# Patient Record
Sex: Female | Born: 1960 | Race: White | Hispanic: No | Smoking: Former smoker
Health system: Southern US, Community
[De-identification: ages and names within clinical notes are randomized; demographics above are authoritative.]

## PROBLEM LIST (undated history)

## (undated) DIAGNOSIS — Z87442 Personal history of urinary calculi: Secondary | ICD-10-CM

## (undated) DIAGNOSIS — I341 Nonrheumatic mitral (valve) prolapse: Secondary | ICD-10-CM

## (undated) DIAGNOSIS — T8859XA Other complications of anesthesia, initial encounter: Secondary | ICD-10-CM

## (undated) DIAGNOSIS — I10 Essential (primary) hypertension: Secondary | ICD-10-CM

## (undated) DIAGNOSIS — J189 Pneumonia, unspecified organism: Secondary | ICD-10-CM

## (undated) DIAGNOSIS — I499 Cardiac arrhythmia, unspecified: Secondary | ICD-10-CM

## (undated) DIAGNOSIS — R112 Nausea with vomiting, unspecified: Secondary | ICD-10-CM

## (undated) DIAGNOSIS — T4145XA Adverse effect of unspecified anesthetic, initial encounter: Secondary | ICD-10-CM

## (undated) DIAGNOSIS — Z9889 Other specified postprocedural states: Secondary | ICD-10-CM

## (undated) DIAGNOSIS — M1990 Unspecified osteoarthritis, unspecified site: Secondary | ICD-10-CM

## (undated) DIAGNOSIS — C4490 Unspecified malignant neoplasm of skin, unspecified: Secondary | ICD-10-CM

## (undated) DIAGNOSIS — K219 Gastro-esophageal reflux disease without esophagitis: Secondary | ICD-10-CM

## (undated) HISTORY — PX: BUNIONECTOMY: SHX129

## (undated) HISTORY — PX: DILATION AND CURETTAGE OF UTERUS: SHX78

## (undated) HISTORY — PX: OTHER SURGICAL HISTORY: SHX169

## (undated) HISTORY — PX: INCONTINENCE SURGERY: SHX676

## (undated) HISTORY — PX: ABDOMINAL HYSTERECTOMY: SHX81

## (undated) HISTORY — DX: Unspecified malignant neoplasm of skin, unspecified: C44.90

## (undated) HISTORY — PX: BREAST SURGERY: SHX581

---

## 2004-09-16 ENCOUNTER — Other Ambulatory Visit: Payer: Self-pay

## 2004-09-16 ENCOUNTER — Emergency Department: Payer: Self-pay | Admitting: Emergency Medicine

## 2007-11-25 ENCOUNTER — Encounter (INDEPENDENT_AMBULATORY_CARE_PROVIDER_SITE_OTHER): Payer: Self-pay | Admitting: *Deleted

## 2007-11-25 ENCOUNTER — Ambulatory Visit (HOSPITAL_COMMUNITY): Admission: RE | Admit: 2007-11-25 | Discharge: 2007-11-25 | Payer: Self-pay | Admitting: *Deleted

## 2009-04-15 ENCOUNTER — Encounter: Admission: RE | Admit: 2009-04-15 | Discharge: 2009-04-15 | Payer: Self-pay | Admitting: Obstetrics

## 2009-04-26 ENCOUNTER — Encounter: Admission: RE | Admit: 2009-04-26 | Discharge: 2009-04-26 | Payer: Self-pay | Admitting: Obstetrics

## 2009-05-26 ENCOUNTER — Ambulatory Visit (HOSPITAL_BASED_OUTPATIENT_CLINIC_OR_DEPARTMENT_OTHER): Admission: RE | Admit: 2009-05-26 | Discharge: 2009-05-26 | Payer: Self-pay | Admitting: Surgery

## 2009-05-26 ENCOUNTER — Encounter: Admission: RE | Admit: 2009-05-26 | Discharge: 2009-05-26 | Payer: Self-pay | Admitting: Surgery

## 2009-05-26 ENCOUNTER — Encounter (INDEPENDENT_AMBULATORY_CARE_PROVIDER_SITE_OTHER): Payer: Self-pay | Admitting: Surgery

## 2010-03-30 ENCOUNTER — Encounter: Admission: RE | Admit: 2010-03-30 | Discharge: 2010-03-30 | Payer: Self-pay | Admitting: Obstetrics

## 2010-04-07 ENCOUNTER — Encounter: Admission: RE | Admit: 2010-04-07 | Discharge: 2010-04-07 | Payer: Self-pay | Admitting: Obstetrics

## 2010-04-19 ENCOUNTER — Ambulatory Visit (HOSPITAL_COMMUNITY): Admission: RE | Admit: 2010-04-19 | Discharge: 2010-04-19 | Payer: Self-pay | Admitting: Obstetrics

## 2010-12-10 ENCOUNTER — Encounter: Payer: Self-pay | Admitting: Obstetrics

## 2011-02-05 LAB — CBC
Platelets: 282 10*3/uL (ref 150–400)
RBC: 4.33 MIL/uL (ref 3.87–5.11)
RDW: 13.4 % (ref 11.5–15.5)
WBC: 5.9 10*3/uL (ref 4.0–10.5)

## 2011-02-05 LAB — URINALYSIS, ROUTINE W REFLEX MICROSCOPIC
Bilirubin Urine: NEGATIVE
Hgb urine dipstick: NEGATIVE

## 2011-02-05 LAB — TYPE AND SCREEN: ABO/RH(D): A POS

## 2011-03-07 ENCOUNTER — Other Ambulatory Visit: Payer: Self-pay | Admitting: Obstetrics

## 2011-03-07 DIAGNOSIS — Z1231 Encounter for screening mammogram for malignant neoplasm of breast: Secondary | ICD-10-CM

## 2011-04-03 ENCOUNTER — Ambulatory Visit
Admission: RE | Admit: 2011-04-03 | Discharge: 2011-04-03 | Disposition: A | Discharge status: Home / Self Care | Payer: BC Managed Care – PPO | Source: Ambulatory Visit | Attending: Obstetrics | Admitting: Obstetrics

## 2011-04-03 DIAGNOSIS — Z1231 Encounter for screening mammogram for malignant neoplasm of breast: Secondary | ICD-10-CM

## 2011-04-03 NOTE — Op Note (Signed)
Alexandria [name], Alexandria [name]               ACCOUNT NO.:  [id number]   MEDICAL RECORD NO.:  [id number]          PATIENT TYPE:  AMB   LOCATION:  DSC                          FACILITY:  [facility]   PHYSICIAN:  Currie Paris, M.D.DATE OF BIRTH:  [birth year]   DATE OF PROCEDURE:  05/26/2009  DATE OF DISCHARGE:                               OPERATIVE REPORT   PREOPERATIVE DIAGNOSES:  1. Indeterminate calcifications, left breast upper outer quadrant.  2. Cyst, right groin.   POSTOPERATIVE DIAGNOSES:  1. Indeterminate calcifications, left breast upper outer quadrant.  2. Cyst, right groin.   OPERATION:  1. Needle local excision of right breast calcifications.  2. Excision of right cyst.   SURGEON:  Currie Paris, MD   ANESTHESIA:  General.   CLINICAL HISTORY:  This is a 50 year old lady who has some  calcifications in the right breast that were undetermined and excisional  biopsy recommended.  She also had a cyst in the right groin line that  was bothersome and she wished to have removed.   DESCRIPTION OF PROCEDURE:  The patient was seen in the holding area and  had no further questions.  We confirmed and I initialed the left breast  as the operative side and the right inguinal groin area as the second  operative site.   I reviewed the mammogram films.  The patient had no further questions.   The patient was taken to the operating room.  After satisfactory general  anesthesia had been obtained, both the groin and the breast were prepped  and draped as a sterile field.  The time-out was done.   I started with the excisional biopsy of the breast.  I made a  curvilinear incision about 1 cm inferior to the guidewire entry site and  went lateral as the guidewire tract from medial to lateral.  I elevated  skin flaps both superiorly and inferiorly and mobilized the guidewire  into the wound.  Using traction on that, I took a cylinder of tissue  around this, and as I was getting deeper  into the breast tissue I saw  some old hemorrhage consistent with her prior biopsy, so I tried to  include all of that in the excisional biopsy.  All of this looked to be  dense fibrotic fibrocystic-type changes.   This was taken off the table for specimen mammography.  While waiting  for that, I made sure everything was dry, put in 0.25% plain Marcaine,  and we closed the incision with interrupted Vicryls plus 4-0 Monocryl  subcuticular and Dermabond.   In reviewing the specimen mammogram, the calcifications appeared to be  fairly proximal in the specimen, and I think the distal tissue was from  a second biopsy that had been benign as a biopsy clip was also included  in the specimen from that biopsy.   Attention was then turned to the groin area.  The previously marked cyst  was then excised in elliptical incision and appeared to be a benign  epidermoid cyst.  The skin was closed with a 4-0 Monocryl subcuticular  and Dermabond.  The patient tolerated the procedure well, and there were no  complications.  All counts were correct.      Currie Paris, M.D.  Electronically Signed     CJS/MEDQ  D:  05/26/2009  T:  05/26/2009  Job:  [id number]   cc:   Lendon Colonel, MD

## 2011-04-03 NOTE — Op Note (Signed)
NAMEANNTOINETTE, HAEFELE               ACCOUNT NO.:  [id number]   MEDICAL RECORD NO.:  [id number]          PATIENT TYPE:  AMB   LOCATION:  DAY                          FACILITY:  [facility]   PHYSICIAN:  Minor B. Earlene Plater, M.D.  DATE OF BIRTH:  [birth year]   DATE OF PROCEDURE:  11/25/2007  DATE OF DISCHARGE:                               OPERATIVE REPORT   PREOPERATIVE DIAGNOSIS:  Excessive menstrual bleeding.   POSTOPERATIVE DIAGNOSIS:  Excessive menstrual bleeding.   PROCEDURE:  Da Vinci total laparoscopic hysterectomy.   SURGEON:  Marina Gravel, M.D.   ASSISTANT:  Genia Del, M.D.   ANESTHESIA:  General.   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  Normal-appearing tubes and ovaries.   SPECIMENS:  Uterus and cervix to pathology.   BLOOD LOSS:  100.   COMPLICATIONS:  None.   INDICATIONS:  Patient with a history of heavy menstrual bleeding, status  post endometrial ablation several months ago without relief of symptoms.  Preop endometrial biopsy was benign as was Pap smear.  The patient was  advised of the risks of surgery including infection, bleeding, damage to  surrounding organs.   PROCEDURE:  The patient taken to operating room and general anesthesia  obtained.  She was prepped and draped in standard fashion.  Foley  catheter inserted into the bladder.  Exam under anesthesia showed a  slightly enlarged retroverted uterus, no adnexal masses.   Speculum inserted.  Uterus sounded to 10 cm.  The #10 RUMI tip was  assembled, inserted and secured in standard manner.   A 10-mm incision placed just above the umbilicus, carried sharply to the  fascia.  The fascia was divided sharply and elevated with Kocher clamps.  Posterior sheath and peritoneum were elevated and entered sharply.  Pursestring suture of 0 Vicryl placed around the fascial defect.  Hassan  cannula inserted and secured.  Pneumoperitoneum obtained with CO2 gas.  The pelvis  inspected as was the upper abdomen.  No abnormalities seen.  The 8-mm ports were placed to the left of midline, one to the right and  10 mm to the right as well, each under direct laparoscopic  visualization.   Trendelenburg position obtained and the robot brought up to the patient  and docked in standard manner.   The pelvis was inspected.  Course of each ureter identified and found to  be well away.  Left round ligament placed on traction, sealed and  divided with the Gyrus PK and monopolar scissors.  The utero-ovarian  pedicle and tube were similarly sealed and divided.  Broad ligament was  skeletonized, exposing the uterine artery,  bladder flap created  sharply.  The entire procedure repeated on the right side in the exact  same manner.   The bladder was advanced caudad.  The uterine artery taken on each side  with Gyrus PK and divided with monopolar scissors.   The anterior colpotomy was made with monopolar scissors around to the  vaginal angles.  This was repeated posteriorly in the same manner.  The  angles were taken down with Gyrus PK.  Uterus was delivered into the  vagina which maintained pneumoperitoneum.  There was no active bleeding  seen.  The vaginal cuff was closed with interrupted figure-of-eight  stitches of 0 PDS.  The pneumoperitoneum was taken down.  Vaginal cuff  and utero-ovarian pedicles inspected.  There were all hemostatic.  Pelvis was irrigated.  No active bleeding seen.  Therefore, procedure  was terminated.   The robot was undocked and the laparoscopic ports removed.  Their sites  were inspected laparoscopically and were hemostatic.  Scope was removed.  Gas released.  Hassan cannula removed.  The umbilical incision was  elevated with [employer] retractors.  Pursestring suture snugged down.  This obliterated the fascial defect and no intra-abdominal contents  herniated through prior to closure.  Skin was closed at each site with 4-  0 Vicryl and  Dermabond.   The patient tolerated the well.  There were no complications.  She was  taken to recovery room in stable condition.  All counts were correct per  the operating staff.      Gerri Spore B. Earlene Plater, M.D.  Electronically Signed     WBD/MEDQ  D:  11/25/2007  T:  11/25/2007  Job:  [id number]

## 2011-08-09 LAB — DIFFERENTIAL
Basophils Relative: 1
Eosinophils Absolute: 0.3
Neutro Abs: 3.7
Neutrophils Relative %: 60

## 2011-08-09 LAB — CBC
Hemoglobin: 13.3
MCHC: 34.5
MCV: 87.3
Platelets: 274
RBC: 4.4
WBC: 6.2

## 2012-04-21 ENCOUNTER — Other Ambulatory Visit: Payer: Self-pay | Admitting: Obstetrics

## 2012-04-21 DIAGNOSIS — Z1231 Encounter for screening mammogram for malignant neoplasm of breast: Secondary | ICD-10-CM

## 2012-05-02 ENCOUNTER — Ambulatory Visit
Admission: RE | Admit: 2012-05-02 | Discharge: 2012-05-02 | Disposition: A | Discharge status: Home / Self Care | Payer: BC Managed Care – PPO | Source: Ambulatory Visit | Attending: Obstetrics | Admitting: Obstetrics

## 2012-05-02 DIAGNOSIS — Z1231 Encounter for screening mammogram for malignant neoplasm of breast: Secondary | ICD-10-CM

## 2012-07-15 DIAGNOSIS — R079 Chest pain, unspecified: Secondary | ICD-10-CM

## 2012-11-19 DIAGNOSIS — C4490 Unspecified malignant neoplasm of skin, unspecified: Secondary | ICD-10-CM

## 2012-11-19 HISTORY — DX: Unspecified malignant neoplasm of skin, unspecified: C44.90

## 2013-06-03 ENCOUNTER — Other Ambulatory Visit: Payer: Self-pay

## 2013-06-03 DIAGNOSIS — Z1231 Encounter for screening mammogram for malignant neoplasm of breast: Secondary | ICD-10-CM

## 2013-06-03 DIAGNOSIS — Z803 Family history of malignant neoplasm of breast: Secondary | ICD-10-CM

## 2013-06-07 DIAGNOSIS — R079 Chest pain, unspecified: Secondary | ICD-10-CM

## 2013-06-08 DIAGNOSIS — R079 Chest pain, unspecified: Secondary | ICD-10-CM

## 2013-06-18 ENCOUNTER — Ambulatory Visit
Admission: RE | Admit: 2013-06-18 | Discharge: 2013-06-18 | Disposition: A | Discharge status: Home / Self Care | Payer: BC Managed Care – PPO | Source: Ambulatory Visit

## 2013-06-18 DIAGNOSIS — Z803 Family history of malignant neoplasm of breast: Secondary | ICD-10-CM

## 2013-06-18 DIAGNOSIS — Z1231 Encounter for screening mammogram for malignant neoplasm of breast: Secondary | ICD-10-CM

## 2013-06-19 ENCOUNTER — Other Ambulatory Visit: Payer: Self-pay | Admitting: Obstetrics

## 2013-06-19 DIAGNOSIS — R928 Other abnormal and inconclusive findings on diagnostic imaging of breast: Secondary | ICD-10-CM

## 2013-06-24 ENCOUNTER — Ambulatory Visit
Admission: RE | Admit: 2013-06-24 | Discharge: 2013-06-24 | Disposition: A | Discharge status: Home / Self Care | Payer: BC Managed Care – PPO | Source: Ambulatory Visit | Attending: Obstetrics | Admitting: Obstetrics

## 2013-06-24 DIAGNOSIS — R928 Other abnormal and inconclusive findings on diagnostic imaging of breast: Secondary | ICD-10-CM

## 2013-07-30 ENCOUNTER — Other Ambulatory Visit: Payer: Self-pay | Admitting: Obstetrics

## 2013-07-30 DIAGNOSIS — N6310 Unspecified lump in the right breast, unspecified quadrant: Secondary | ICD-10-CM

## 2013-08-03 ENCOUNTER — Telehealth: Payer: Self-pay | Admitting: Genetic Counselor

## 2013-08-03 NOTE — Telephone Encounter (Signed)
NOT ABLE TO MESSAGE REFERRING OFFICE AWARE.

## 2013-08-03 NOTE — Telephone Encounter (Signed)
S/W PT IN RE TO GENETIC APPT 11/20 @ 1 W/KAREN POWELL REFERRING DR. Ernestina Penna WELCOME PACKET MAILED.

## 2013-08-11 ENCOUNTER — Other Ambulatory Visit: Payer: BC Managed Care – PPO

## 2013-08-12 ENCOUNTER — Ambulatory Visit
Admission: RE | Admit: 2013-08-12 | Discharge: 2013-08-12 | Disposition: A | Discharge status: Home / Self Care | Payer: BC Managed Care – PPO | Source: Ambulatory Visit | Attending: Obstetrics | Admitting: Obstetrics

## 2013-08-12 ENCOUNTER — Other Ambulatory Visit: Payer: Self-pay | Admitting: Obstetrics

## 2013-08-12 DIAGNOSIS — N6310 Unspecified lump in the right breast, unspecified quadrant: Secondary | ICD-10-CM

## 2013-08-27 ENCOUNTER — Ambulatory Visit
Admission: RE | Admit: 2013-08-27 | Discharge: 2013-08-27 | Disposition: A | Discharge status: Home / Self Care | Payer: BC Managed Care – PPO | Source: Ambulatory Visit | Attending: Obstetrics | Admitting: Obstetrics

## 2013-08-27 DIAGNOSIS — N6310 Unspecified lump in the right breast, unspecified quadrant: Secondary | ICD-10-CM

## 2013-10-08 ENCOUNTER — Encounter (INDEPENDENT_AMBULATORY_CARE_PROVIDER_SITE_OTHER): Payer: Self-pay

## 2013-10-08 ENCOUNTER — Encounter: Payer: BC Managed Care – PPO | Admitting: Genetic Counselor

## 2013-10-08 ENCOUNTER — Other Ambulatory Visit: Payer: BC Managed Care – PPO | Admitting: Lab

## 2013-10-13 ENCOUNTER — Other Ambulatory Visit: Payer: BC Managed Care – PPO | Admitting: Lab

## 2013-10-13 ENCOUNTER — Ambulatory Visit (HOSPITAL_BASED_OUTPATIENT_CLINIC_OR_DEPARTMENT_OTHER): Payer: BC Managed Care – PPO | Admitting: Genetic Counselor

## 2013-10-13 DIAGNOSIS — IMO0002 Reserved for concepts with insufficient information to code with codable children: Secondary | ICD-10-CM

## 2013-10-13 DIAGNOSIS — Z800 Family history of malignant neoplasm of digestive organs: Secondary | ICD-10-CM

## 2013-10-13 DIAGNOSIS — Z803 Family history of malignant neoplasm of breast: Secondary | ICD-10-CM

## 2013-10-14 ENCOUNTER — Encounter: Payer: Self-pay | Admitting: Genetic Counselor

## 2013-10-14 DIAGNOSIS — C4490 Unspecified malignant neoplasm of skin, unspecified: Secondary | ICD-10-CM | POA: Insufficient documentation

## 2013-10-14 NOTE — Progress Notes (Addendum)
Dr.  Ernestina Penna, Alphonsus Sias., MD requested a consultation for genetic counseling and Alexandria [name] assessment for Alexandria Alexandria [name], a 52 y.o. female, for discussion of her family history of breast and colon cancer.  She presents to clinic today with her mother, Alexandria Alexandria [name], to discuss the possibility of a genetic predisposition to cancer, and to further clarify her risks, as well as her family members' risks for cancer.   HISTORY OF PRESENT ILLNESS: Alexandria Alexandria [name] is a 52 y.o. female with no personal history of cancer. In her late 56s she had a mole removed that was reported at that time as melanoma, but has since felt that it was not true melanoma.  She has had 2 spots removed that have been determined to be pre-melanoma and a third spot is being watched. She has had two breast biopsies, one in each breast, that were benign.  Alexandria Alexandria [name] has had one colonoscopy that reportedly did not have polyps.  Past Medical History  Diagnosis Date  . Skin cancer 2014    2 spots labeled "pre-melanoma"    History reviewed. No pertinent past surgical history.  History   Social History  . Marital Status: Legally Separated    Spouse Name: N/A    Number of Children: 1  . Years of Education: N/A   Occupational History  .  [employer]   Social History Main Topics  . Smoking status: Former Smoker -- 0.50 packs/day for 13 years    Types: Cigarettes    Quit date: 10/14/1992  . Smokeless tobacco: None  . Alcohol Use: No  . Drug Use: None  . Sexual Activity: None   Other Topics Concern  . None   Social History Narrative  . None    REPRODUCTIVE HISTORY AND PERSONAL Alexandria [name] ASSESSMENT FACTORS: Menarche was at age 15.   postmenopausal Uterus Intact: no Ovaries Intact: yes G1P1A0, first live birth at age 38  She has not previously undergone treatment for infertility.   Oral Contraceptive use: 1 years   She has not used HRT in the past.    FAMILY HISTORY:  We obtained a detailed, 4-generation family history.   Significant diagnoses are listed below: Family History  Problem Relation Age of Onset  . Colon polyps Mother     "cancer inside the polyp"  . Breast cancer Mother 66    ER- cancer  . Colon cancer Maternal Grandmother     dx in her 97s  . Spina bifida Brother   . Brain cancer Other     maternal grandmother's sister; dx in her 26s  . Breast cancer Cousin     mother's maternal cousin; dx in her late 50s  . Ovarian cancer Cousin     mother's paternal cousin dx in her 35s  . Breast cancer Cousin     mother's paternal first cousin  . Kidney cancer Cousin     mother's paternal cousin  Phyllis's mother had a sister who died in a car accident at 24 and her father was an only child.  She does not know much information about her father's side of the family.  Patient's maternal ancestors are of unknown descent, and paternal ancestors are of unknown descent. There is no reported Ashkenazi Jewish ancestry. There is no known consanguinity.  GENETIC COUNSELING ASSESSMENT: Alexandria Alexandria [name] is a 52 y.o. female with a family history of breast and colon cancer which somewhat suggestive of a hereditary cancer syndrome and predisposition to cancer. We, therefore, discussed and recommended the  following at today's visit.   DISCUSSION: We reviewed the characteristics, features and inheritance patterns of hereditary cancer syndromes. We also discussed genetic testing, including the appropriate family members to test, the process of testing, insurance coverage and turn-around-time for results. We discussed that there are several genes that are associated with breast cancer.  Her mother had ER- breast cancer, that can be associated with BRCA mutations as well as PALB2 mutations.  In taking the family history, Alexandria [name]'s mother indicated that her paternal cousin had ovarian cancer, as well as another cousin having breast cancer.  Based on the fact that Alexandria Alexandria [name]'s mother had been affected with breast cancer, it is most  appropriate to test her.  We discussed this and Alexandria Alexandria [name] and her mother agree to the plan on having Alexandria Alexandria [name] tested first, and if that comes back positive to have Alexandria Alexandria [name] tested at that time.  PLAN: After considering the risks, benefits, and limitations, Alexandria Alexandria [name] declined genetic testing until learning about her mother's genetic test results. We discussed the implications of a positive, negative and/ or variant of uncertain significance genetic test result. Results should be available within approximately 3 weeks' time, at which point we could offer testing on Alexandria Alexandria [name], if there is something for which to test.  We encouraged Alexandria Alexandria [name] to remain in contact with cancer genetics annually so that we can continuously update the family history and inform her of any changes in cancer genetics and testing that may be of benefit for her family. Alexandria Alexandria [name]'s questions were answered to her satisfaction today. Our contact information was provided should additional questions or concerns arise.  The patient was seen for a total of 60 minutes, greater than 50% of which was spent face-to-face counseling.  This note will also be sent to the referring provider via the electronic medical record. The patient will be supplied with a summary of this genetic counseling discussion as well as educational information on the discussed hereditary cancer syndromes following the conclusion of their visit.   Patient was discussed with Dr. Drue Second.   _______________________________________________________________________ For Office Staff:  Number of people involved in session: 2 Was an Intern/ student involved with case: no

## 2014-05-31 ENCOUNTER — Other Ambulatory Visit: Payer: Self-pay

## 2014-05-31 DIAGNOSIS — Z1231 Encounter for screening mammogram for malignant neoplasm of breast: Secondary | ICD-10-CM

## 2014-05-31 DIAGNOSIS — Z803 Family history of malignant neoplasm of breast: Secondary | ICD-10-CM

## 2014-06-21 ENCOUNTER — Ambulatory Visit
Admission: RE | Admit: 2014-06-21 | Discharge: 2014-06-21 | Disposition: A | Discharge status: Home / Self Care | Payer: BC Managed Care – PPO | Source: Ambulatory Visit

## 2014-06-21 ENCOUNTER — Encounter (INDEPENDENT_AMBULATORY_CARE_PROVIDER_SITE_OTHER): Payer: Self-pay

## 2014-06-21 DIAGNOSIS — Z1231 Encounter for screening mammogram for malignant neoplasm of breast: Secondary | ICD-10-CM

## 2014-06-21 DIAGNOSIS — Z803 Family history of malignant neoplasm of breast: Secondary | ICD-10-CM

## 2016-07-07 NOTE — H&P (Signed)
TOTAL KNEE ADMISSION H&P  Patient is being admitted for left total knee arthroplasty.  Subjective:  Chief Complaint:     Left knee primary OA / pain  HPI: Alexandria [name], 55 y.o. female, has a history of pain and functional disability in the left knee due to trauma and arthritis and has failed non-surgical conservative treatments for greater than 12 weeks to include NSAID's and/or analgesics, corticosteriod injections, viscosupplementation injections, use of assistive devices and activity modification.  Onset of symptoms was gradual, starting ~3 years ago (incident occurred Nov 04, 2013)  with gradually worsening course since that time. The patient noted prior procedures on the knee to include  arthroscopy and microfracture on the left knee(s).  Patient currently rates pain in the left knee(s) at 10 out of 10 with activity. Patient has night pain, worsening of pain with activity and weight bearing, pain that interferes with activities of daily living, pain with passive range of motion, crepitus and joint swelling.  Patient has evidence of periarticular osteophytes and joint space narrowing by imaging studies.  There is no active infection.   Risks, benefits and expectations were discussed with the patient.  Risks including but not limited to the risk of anesthesia, blood clots, nerve damage, blood vessel damage, failure of the prosthesis, infection and up to and including death.  Patient understand the risks, benefits and expectations and wishes to proceed with surgery.   PCP: Alexandria Dach., MD  D/C Plans:      Home with HHPT  Post-op Meds:       No Rx given  Tranexamic Acid:      To be given - IV  Decadron:      Is to be given  FYI:     ASA  Norco    Patient Active Problem List   Diagnosis Date Noted  . Skin cancer    Past Medical History:  Diagnosis Date  . Skin cancer 2014   2 spots labeled "pre-melanoma"    No past surgical history on file.  No prescriptions prior to  admission.   Allergies  Allergen Reactions  . Other Other (See Comments)    Alpha gal    Social History  Substance Use Topics  . Smoking status: Former Smoker    Packs/day: 0.50    Years: 13.00    Types: Cigarettes    Quit date: 10/14/1992  . Smokeless tobacco: Not on file  . Alcohol use No    Family History  Problem Relation Age of Onset  . Colon polyps Mother     "cancer inside the polyp"  . Breast cancer Mother 34    ER- cancer  . Colon cancer Maternal Grandmother     dx in her 68s  . Spina bifida Brother   . Brain cancer Other     maternal grandmother's sister; dx in her 54s  . Breast cancer Cousin     mother's maternal cousin; dx in her late 86s  . Ovarian cancer Cousin     mother's paternal cousin dx in her 60s  . Breast cancer Cousin     mother's paternal first cousin  . Kidney cancer Cousin     mother's paternal cousin     Review of Systems  Constitutional: Negative.   HENT: Negative.   Eyes: Negative.   Respiratory: Negative.   Cardiovascular: Negative.   Gastrointestinal: Positive for heartburn.  Genitourinary: Positive for frequency.  Musculoskeletal: Positive for joint pain.  Skin: Negative.   Neurological: Negative.  Endo/Heme/Allergies: Negative.   Psychiatric/Behavioral: The patient is nervous/anxious.     Objective:  Physical Exam  Constitutional: She is oriented to person, place, and time. She appears well-developed.  HENT:  Head: Normocephalic.  Eyes: Pupils are equal, round, and reactive to light.  Neck: Neck supple. No JVD present. No tracheal deviation present. No thyromegaly present.  Cardiovascular: Normal rate, regular rhythm and intact distal pulses.   Murmur heard. Respiratory: Effort normal and breath sounds normal. No respiratory distress. She has no wheezes.  GI: Soft. There is no tenderness. There is no guarding.  Lymphadenopathy:    She has no cervical adenopathy.  Neurological: She is alert and oriented to person,  place, and time. A sensory deficit (bilateral LE numbness) is present.  Skin: Skin is warm and dry.  Psychiatric: She has a normal mood and affect.      Imaging Review Plain radiographs demonstrate severe degenerative joint disease of the left knee(s).   The bone quality appears to be good for age and reported activity level.  Assessment/Plan:  End stage arthritis, left knee   The patient history, physical examination, clinical judgment of the provider and imaging studies are consistent with end stage degenerative joint disease of the left knee(s) and total knee arthroplasty is deemed medically necessary. The treatment options including medical management, injection therapy arthroscopy and arthroplasty were discussed at length. The risks and benefits of total knee arthroplasty were presented and reviewed. The risks due to aseptic loosening, infection, stiffness, patella tracking problems, thromboembolic complications and other imponderables were discussed. The patient acknowledged the explanation, agreed to proceed with the plan and consent was signed. Patient is being admitted for inpatient treatment for surgery, pain control, PT, OT, prophylactic antibiotics, VTE prophylaxis, progressive ambulation and ADL's and discharge planning. The patient is planning to be discharged home with home health services.     Alexandria [name] Alexandria Brock   PA-C  07/07/2016, 3:41 PM

## 2016-07-09 ENCOUNTER — Encounter (HOSPITAL_COMMUNITY): Payer: Self-pay

## 2016-07-09 ENCOUNTER — Encounter (HOSPITAL_COMMUNITY)
Admission: RE | Admit: 2016-07-09 | Discharge: 2016-07-09 | Disposition: A | Discharge status: Home / Self Care | Payer: Self-pay | Source: Ambulatory Visit | Attending: Orthopedic Surgery | Admitting: Orthopedic Surgery

## 2016-07-09 DIAGNOSIS — M1712 Unilateral primary osteoarthritis, left knee: Secondary | ICD-10-CM | POA: Insufficient documentation

## 2016-07-09 DIAGNOSIS — Z0183 Encounter for blood typing: Secondary | ICD-10-CM | POA: Insufficient documentation

## 2016-07-09 DIAGNOSIS — Z01812 Encounter for preprocedural laboratory examination: Secondary | ICD-10-CM | POA: Insufficient documentation

## 2016-07-09 HISTORY — DX: Essential (primary) hypertension: I10

## 2016-07-09 HISTORY — DX: Gastro-esophageal reflux disease without esophagitis: K21.9

## 2016-07-09 HISTORY — DX: Unspecified osteoarthritis, unspecified site: M19.90

## 2016-07-09 HISTORY — DX: Adverse effect of unspecified anesthetic, initial encounter: T41.45XA

## 2016-07-09 HISTORY — DX: Cardiac arrhythmia, unspecified: I49.9

## 2016-07-09 HISTORY — DX: Other complications of anesthesia, initial encounter: T88.59XA

## 2016-07-09 HISTORY — DX: Nonrheumatic mitral (valve) prolapse: I34.1

## 2016-07-09 HISTORY — DX: Pneumonia, unspecified organism: J18.9

## 2016-07-09 HISTORY — DX: Nausea with vomiting, unspecified: Z98.890

## 2016-07-09 HISTORY — DX: Other specified postprocedural states: R11.2

## 2016-07-09 LAB — SURGICAL PCR SCREEN
MRSA, PCR: NEGATIVE
STAPHYLOCOCCUS AUREUS: NEGATIVE

## 2016-07-09 LAB — ABO/RH: ABO/RH(D): A POS

## 2016-07-09 NOTE — Patient Instructions (Signed)
Alexandria [name]  07/09/2016   Your procedure is scheduled on: Monday 07/16/2016  Report to [facility] Main  Entrance take [name]  elevators to 3rd floor to  [name] at     0730  AM.  Call this number if you have problems the morning of surgery [phone]   Remember: ONLY 1 PERSON MAY GO WITH YOU TO SHORT STAY TO GET  READY MORNING OF [place].   Do not eat food or drink liquids :After Midnight.     Take these medicines the morning of surgery with A SIP OF WATER: Protonix                                 You may not have any metal on your body including hair pins and              piercings  Do not wear jewelry, make-up, lotions, powders or perfumes, deodorant             Do not wear nail polish.  Do not shave  48 hours prior to surgery.              Men may shave face and neck.   Do not bring valuables to the hospital. Ward.  Contacts, dentures or bridgework may not be worn into surgery.  Leave suitcase in the car. After surgery it may be brought to your room.                 Please read over the following fact sheets you were given: _____________________________________________________________________             [facility] - Preparing for Surgery Before surgery, you can play an important role.  Because skin is not sterile, your skin needs to be as free of germs as possible.  You can reduce the number of germs on your skin by washing with CHG (chlorahexidine gluconate) soap before surgery.  CHG is an antiseptic cleaner which kills germs and bonds with the skin to continue killing germs even after washing. Please DO NOT use if you have an allergy to CHG or antibacterial soaps.  If your skin becomes reddened/irritated stop using the CHG and inform your nurse when you arrive at Short Stay. Do not shave (including legs and underarms) for at least 48 hours prior to the first CHG shower.  You  may shave your face/neck. Please follow these instructions carefully:  1.  Shower with CHG Soap the night before surgery and the  morning of Surgery.  2.  If you choose to wash your hair, wash your hair first as usual with your  normal  shampoo.  3.  After you shampoo, rinse your hair and body thoroughly to remove the  shampoo.                           4.  Use CHG as you would any other liquid soap.  You can apply chg directly  to the skin and wash                       Gently with a scrungie or clean washcloth.  5.  Apply the CHG Soap to your body ONLY FROM THE NECK DOWN.   Do not use on face/ open                           Wound or open sores. Avoid contact with eyes, ears mouth and genitals (private parts).                       Wash face,  Genitals (private parts) with your normal soap.             6.  Wash thoroughly, paying special attention to the area where your surgery  will be performed.  7.  Thoroughly rinse your body with warm water from the neck down.  8.  DO NOT shower/wash with your normal soap after using and rinsing off  the CHG Soap.                9.  Pat yourself dry with a clean towel.            10.  Wear clean pajamas.            11.  Place clean sheets on your bed the night of your first shower and do not  sleep with pets. Day of Surgery : Do not apply any lotions/deodorants the morning of surgery.  Please wear clean clothes to the hospital/surgery center.  FAILURE TO FOLLOW THESE INSTRUCTIONS MAY RESULT IN THE CANCELLATION OF YOUR SURGERY PATIENT SIGNATURE_________________________________  NURSE SIGNATURE__________________________________  ________________________________________________________________________   Alexandria [name]  An incentive spirometer is a tool that can help keep your lungs clear and active. This tool measures how well you are filling your lungs with each breath. Taking long deep breaths may help reverse or decrease the chance of  developing breathing (pulmonary) problems (especially infection) following:  A long period of time when you are unable to move or be active. BEFORE THE PROCEDURE   If the spirometer includes an indicator to show your best effort, your nurse or respiratory therapist will set it to a desired goal.  If possible, sit up straight or lean slightly forward. Try not to slouch.  Hold the incentive spirometer in an upright position. INSTRUCTIONS FOR USE  1. Sit on the edge of your bed if possible, or sit up as far as you can in bed or on a chair. 2. Hold the incentive spirometer in an upright position. 3. Breathe out normally. 4. Place the mouthpiece in your mouth and seal your lips tightly around it. 5. Breathe in slowly and as deeply as possible, raising the piston or the ball toward the top of the column. 6. Hold your breath for 3-5 seconds or for as long as possible. Allow the piston or ball to fall to the bottom of the column. 7. Remove the mouthpiece from your mouth and breathe out normally. 8. Rest for a few seconds and repeat Steps 1 through 7 at least 10 times every 1-2 hours when you are awake. Take your time and take a few normal breaths between deep breaths. 9. The spirometer may include an indicator to show your best effort. Use the indicator as a goal to work toward during each repetition. 10. After each set of 10 deep breaths, practice coughing to be sure your lungs are clear. If you have an incision (the cut made at the time of surgery), support your incision when coughing by placing a  pillow or rolled up towels firmly against it. Once you are able to get out of bed, walk around indoors and cough well. You may stop using the incentive spirometer when instructed by your caregiver.  RISKS AND COMPLICATIONS  Take your time so you do not get dizzy or light-headed.  If you are in pain, you may need to take or ask for pain medication before doing incentive spirometry. It is harder to take a  deep breath if you are having pain. AFTER USE  Rest and breathe slowly and easily.  It can be helpful to keep track of a log of your progress. Your caregiver can provide you with a simple table to help with this. If you are using the spirometer at home, follow these instructions: [place] IF:   You are having difficultly using the spirometer.  You have trouble using the spirometer as often as instructed.  Your pain medication is not giving enough relief while using the spirometer.  You develop fever of 100.5 F (38.1 C) or higher. SEEK IMMEDIATE MEDICAL CARE IF:   You cough up bloody sputum that had not been present before.  You develop fever of 102 F (38.9 C) or greater.  You develop worsening pain at or near the incision site. MAKE SURE YOU:   Understand these instructions.  Will watch your condition.  Will get help right away if you are not doing well or get worse. Document Released: 03/18/2007 Document Revised: 01/28/2012 Document Reviewed: 05/19/2007 ExitCare Patient Information 2014 ExitCare, [place].   ________________________________________________________________________  WHAT IS A BLOOD TRANSFUSION? Blood Transfusion Information  A transfusion is the replacement of blood or some of its parts. Blood is made up of multiple cells which provide different functions.  Red blood cells carry oxygen and are used for blood loss replacement.  White blood cells fight against infection.  Platelets control bleeding.  Plasma helps clot blood.  Other blood products are available for specialized needs, such as hemophilia or other clotting disorders. BEFORE THE TRANSFUSION  Who gives blood for transfusions?   Healthy volunteers who are fully evaluated to make sure their blood is safe. This is blood bank blood. Transfusion therapy is the safest it has ever been in the practice of medicine. Before blood is taken from a donor, a complete history is taken to make  sure that person has no history of diseases nor engages in risky social behavior (examples are intravenous drug use or sexual activity with multiple partners). The donor's travel history is screened to minimize risk of transmitting infections, such as malaria. The donated blood is tested for signs of infectious diseases, such as HIV and hepatitis. The blood is then tested to be sure it is compatible with you in order to minimize the chance of a transfusion reaction. If you or a relative donates blood, this is often done in anticipation of surgery and is not appropriate for emergency situations. It takes many days to process the donated blood. RISKS AND COMPLICATIONS Although transfusion therapy is very safe and saves many lives, the main dangers of transfusion include:   Getting an infectious disease.  Developing a transfusion reaction. This is an allergic reaction to something in the blood you were given. Every precaution is taken to prevent this. The decision to have a blood transfusion has been considered carefully by your caregiver before blood is given. Blood is not given unless the benefits outweigh the risks. AFTER THE TRANSFUSION  Right after receiving a blood transfusion, you  will usually feel much better and more energetic. This is especially true if your red blood cells have gotten low (anemic). The transfusion raises the level of the red blood cells which carry oxygen, and this usually causes an energy increase.  The nurse administering the transfusion will monitor you carefully for complications. HOME CARE INSTRUCTIONS  No special instructions are needed after a transfusion. You may find your energy is better. Speak with your caregiver about any limitations on activity for underlying diseases you may have. SEEK MEDICAL CARE IF:   Your condition is not improving after your transfusion.  You develop redness or irritation at the intravenous (IV) site. SEEK IMMEDIATE MEDICAL CARE IF:   Any of the following symptoms occur over the next 12 hours:  Shaking chills.  You have a temperature by mouth above 102 F (38.9 C), not controlled by medicine.  Chest, back, or muscle pain.  People around you feel you are not acting correctly or are confused.  Shortness of breath or difficulty breathing.  Dizziness and fainting.  You get a rash or develop hives.  You have a decrease in urine output.  Your urine turns a dark color or changes to pink, red, or brown. Any of the following symptoms occur over the next 10 days:  You have a temperature by mouth above 102 F (38.9 C), not controlled by medicine.  Shortness of breath.  Weakness after normal activity.  The white part of the eye turns yellow (jaundice).  You have a decrease in the amount of urine or are urinating less often.  Your urine turns a dark color or changes to pink, red, or brown. Document Released: 11/02/2000 Document Revised: 01/28/2012 Document Reviewed: 06/21/2008 [facility] Patient Information 2014 [place], [place].  _______________________________________________________________________

## 2016-07-16 ENCOUNTER — Inpatient Hospital Stay (HOSPITAL_COMMUNITY): Payer: Worker's Compensation | Admitting: Registered Nurse

## 2016-07-16 ENCOUNTER — Encounter (HOSPITAL_COMMUNITY)
Admission: RE | Disposition: A | Discharge status: Home Health | Payer: Self-pay | Source: Ambulatory Visit | Attending: Orthopedic Surgery

## 2016-07-16 ENCOUNTER — Encounter (HOSPITAL_COMMUNITY): Payer: Self-pay | Admitting: *Deleted

## 2016-07-16 ENCOUNTER — Inpatient Hospital Stay (HOSPITAL_COMMUNITY)
Admission: RE | Admit: 2016-07-16 | Discharge: 2016-07-17 | DRG: 470 | Disposition: A | Discharge status: Home Health | Payer: Worker's Compensation | Source: Ambulatory Visit | Attending: Orthopedic Surgery | Admitting: Orthopedic Surgery

## 2016-07-16 DIAGNOSIS — M659 Synovitis and tenosynovitis, unspecified: Secondary | ICD-10-CM | POA: Diagnosis present

## 2016-07-16 DIAGNOSIS — K219 Gastro-esophageal reflux disease without esophagitis: Secondary | ICD-10-CM | POA: Diagnosis present

## 2016-07-16 DIAGNOSIS — Z85820 Personal history of malignant melanoma of skin: Secondary | ICD-10-CM

## 2016-07-16 DIAGNOSIS — I10 Essential (primary) hypertension: Secondary | ICD-10-CM | POA: Diagnosis present

## 2016-07-16 DIAGNOSIS — Z96652 Presence of left artificial knee joint: Secondary | ICD-10-CM

## 2016-07-16 DIAGNOSIS — M1712 Unilateral primary osteoarthritis, left knee: Principal | ICD-10-CM | POA: Diagnosis present

## 2016-07-16 DIAGNOSIS — M25562 Pain in left knee: Secondary | ICD-10-CM | POA: Diagnosis present

## 2016-07-16 DIAGNOSIS — R110 Nausea: Secondary | ICD-10-CM | POA: Diagnosis not present

## 2016-07-16 DIAGNOSIS — T40605A Adverse effect of unspecified narcotics, initial encounter: Secondary | ICD-10-CM | POA: Diagnosis not present

## 2016-07-16 DIAGNOSIS — Z96659 Presence of unspecified artificial knee joint: Secondary | ICD-10-CM

## 2016-07-16 DIAGNOSIS — Z87891 Personal history of nicotine dependence: Secondary | ICD-10-CM

## 2016-07-16 HISTORY — PX: TOTAL KNEE ARTHROPLASTY: SHX125

## 2016-07-16 LAB — TYPE AND SCREEN
ABO/RH(D): A POS
Antibody Screen: NEGATIVE

## 2016-07-16 SURGERY — ARTHROPLASTY, KNEE, TOTAL
Anesthesia: Spinal | Site: Knee | Laterality: Left

## 2016-07-16 MED ORDER — PHENOL 1.4 % MT LIQD
1.0000 | OROMUCOSAL | Status: DC | PRN
  Filled 2016-07-16: qty 177

## 2016-07-16 MED ORDER — ONDANSETRON HCL 4 MG PO TABS
4.0000 mg | ORAL_TABLET | Freq: Four times a day (QID) | ORAL | Status: DC | PRN
Start: 2016-07-16 — End: 2016-07-17

## 2016-07-16 MED ORDER — FENTANYL CITRATE (PF) 100 MCG/2ML IJ SOLN
25.0000 ug | INTRAMUSCULAR | Status: DC | PRN
  Administered 2016-07-16 (×2): 50 ug via INTRAVENOUS

## 2016-07-16 MED ORDER — ONDANSETRON HCL 4 MG/2ML IJ SOLN
INTRAMUSCULAR | Status: DC | PRN
  Administered 2016-07-16: 4 mg via INTRAVENOUS

## 2016-07-16 MED ORDER — PROPOFOL 10 MG/ML IV BOLUS
INTRAVENOUS | Status: DC | PRN
  Administered 2016-07-16: 20 mg via INTRAVENOUS

## 2016-07-16 MED ORDER — HYDROCODONE-ACETAMINOPHEN 7.5-325 MG PO TABS
1.0000 | ORAL_TABLET | ORAL | Status: DC
  Administered 2016-07-16: 1 via ORAL
  Administered 2016-07-16 – 2016-07-17 (×5): 2 via ORAL
  Filled 2016-07-16 (×2): qty 2
  Filled 2016-07-16: qty 1
  Filled 2016-07-16 (×3): qty 2

## 2016-07-16 MED ORDER — KETOROLAC TROMETHAMINE 30 MG/ML IJ SOLN
INTRAMUSCULAR | Status: AC
  Filled 2016-07-16: qty 1

## 2016-07-16 MED ORDER — LIDOCAINE HCL (CARDIAC) 20 MG/ML IV SOLN
INTRAVENOUS | Status: AC
  Filled 2016-07-16: qty 5

## 2016-07-16 MED ORDER — BISACODYL 10 MG RE SUPP: 10.0000 mg | Freq: Every day | RECTAL | Status: DC | PRN

## 2016-07-16 MED ORDER — SODIUM CHLORIDE 0.9 % IJ SOLN
INTRAMUSCULAR | Status: DC | PRN
  Administered 2016-07-16: 29 mL

## 2016-07-16 MED ORDER — KETOROLAC TROMETHAMINE 30 MG/ML IJ SOLN
INTRAMUSCULAR | Status: DC | PRN
  Administered 2016-07-16: 30 mg

## 2016-07-16 MED ORDER — DOCUSATE SODIUM 100 MG PO CAPS
100.0000 mg | ORAL_CAPSULE | Freq: Two times a day (BID) | ORAL | Status: DC
Start: 2016-07-16 — End: 2016-07-17
  Administered 2016-07-17: 100 mg via ORAL
  Filled 2016-07-16: qty 1

## 2016-07-16 MED ORDER — STERILE WATER FOR IRRIGATION IR SOLN
Status: DC | PRN
  Administered 2016-07-16: 2000 mL

## 2016-07-16 MED ORDER — TRANEXAMIC ACID 1000 MG/10ML IV SOLN
1000.0000 mg | Freq: Once | INTRAVENOUS | Status: AC
  Administered 2016-07-16: 1000 mg via INTRAVENOUS
  Filled 2016-07-16: qty 10

## 2016-07-16 MED ORDER — ASPIRIN 81 MG PO CHEW: 81.0000 mg | CHEWABLE_TABLET | Freq: Two times a day (BID) | ORAL | 0 refills | Status: AC

## 2016-07-16 MED ORDER — ASPIRIN 81 MG PO CHEW
81.0000 mg | CHEWABLE_TABLET | Freq: Two times a day (BID) | ORAL | Status: DC
Start: 2016-07-16 — End: 2016-07-17
  Administered 2016-07-16 – 2016-07-17 (×2): 81 mg via ORAL
  Filled 2016-07-16 (×2): qty 1

## 2016-07-16 MED ORDER — POLYETHYLENE GLYCOL 3350 17 G PO PACK: 17.0000 g | PACK | Freq: Two times a day (BID) | ORAL | 0 refills | Status: AC

## 2016-07-16 MED ORDER — DEXAMETHASONE SODIUM PHOSPHATE 10 MG/ML IJ SOLN
10.0000 mg | Freq: Once | INTRAMUSCULAR | Status: AC
  Administered 2016-07-17: 10 mg via INTRAVENOUS
  Filled 2016-07-16: qty 1

## 2016-07-16 MED ORDER — DOCUSATE SODIUM 100 MG PO CAPS: 100.0000 mg | ORAL_CAPSULE | Freq: Two times a day (BID) | ORAL | 0 refills | Status: AC

## 2016-07-16 MED ORDER — ONDANSETRON HCL 4 MG/2ML IJ SOLN
4.0000 mg | Freq: Four times a day (QID) | INTRAMUSCULAR | Status: DC | PRN
  Administered 2016-07-16: 4 mg via INTRAVENOUS
  Filled 2016-07-16: qty 2

## 2016-07-16 MED ORDER — CEFAZOLIN SODIUM-DEXTROSE 2-4 GM/100ML-% IV SOLN
INTRAVENOUS | Status: AC
  Filled 2016-07-16: qty 100

## 2016-07-16 MED ORDER — PROMETHAZINE HCL 25 MG/ML IJ SOLN
6.2500 mg | Freq: Four times a day (QID) | INTRAMUSCULAR | Status: DC | PRN
  Administered 2016-07-16: 12.5 mg via INTRAVENOUS
  Filled 2016-07-16: qty 1

## 2016-07-16 MED ORDER — FENTANYL CITRATE (PF) 100 MCG/2ML IJ SOLN
INTRAMUSCULAR | Status: AC
  Administered 2016-07-16: 50 ug via INTRAVENOUS
  Filled 2016-07-16: qty 2

## 2016-07-16 MED ORDER — 0.9 % SODIUM CHLORIDE (POUR BTL) OPTIME
TOPICAL | Status: DC | PRN
  Administered 2016-07-16: 1000 mL

## 2016-07-16 MED ORDER — SODIUM CHLORIDE 0.9 % IV SOLN
INTRAVENOUS | Status: DC
  Administered 2016-07-16: 23:00:00 via INTRAVENOUS
  Filled 2016-07-16 (×6): qty 1000

## 2016-07-16 MED ORDER — BUPIVACAINE-EPINEPHRINE (PF) 0.25% -1:200000 IJ SOLN
INTRAMUSCULAR | Status: AC
  Filled 2016-07-16: qty 30

## 2016-07-16 MED ORDER — HYDROCODONE-ACETAMINOPHEN 7.5-325 MG PO TABS: 1.0000 | ORAL_TABLET | ORAL | 0 refills | Status: AC | PRN

## 2016-07-16 MED ORDER — FENTANYL CITRATE (PF) 100 MCG/2ML IJ SOLN
INTRAMUSCULAR | Status: AC
  Filled 2016-07-16: qty 2

## 2016-07-16 MED ORDER — SODIUM CHLORIDE 0.9 % IJ SOLN
INTRAMUSCULAR | Status: AC
  Filled 2016-07-16: qty 50

## 2016-07-16 MED ORDER — CELECOXIB 200 MG PO CAPS
200.0000 mg | ORAL_CAPSULE | Freq: Two times a day (BID) | ORAL | Status: DC
  Administered 2016-07-16 – 2016-07-17 (×2): 200 mg via ORAL
  Filled 2016-07-16 (×2): qty 1

## 2016-07-16 MED ORDER — POLYETHYLENE GLYCOL 3350 17 G PO PACK
17.0000 g | PACK | Freq: Two times a day (BID) | ORAL | Status: DC
  Administered 2016-07-17: 17 g via ORAL
  Filled 2016-07-16: qty 1

## 2016-07-16 MED ORDER — DIPHENHYDRAMINE HCL 25 MG PO CAPS: 25.0000 mg | ORAL_CAPSULE | Freq: Four times a day (QID) | ORAL | Status: DC | PRN

## 2016-07-16 MED ORDER — METHOCARBAMOL 500 MG PO TABS: 500.0000 mg | ORAL_TABLET | Freq: Four times a day (QID) | ORAL | 0 refills | Status: AC | PRN

## 2016-07-16 MED ORDER — PANTOPRAZOLE SODIUM 40 MG PO TBEC
40.0000 mg | DELAYED_RELEASE_TABLET | Freq: Every day | ORAL | Status: DC
  Administered 2016-07-16 – 2016-07-17 (×2): 40 mg via ORAL
  Filled 2016-07-16 (×2): qty 1

## 2016-07-16 MED ORDER — HYDROMORPHONE HCL 1 MG/ML IJ SOLN
0.5000 mg | INTRAMUSCULAR | Status: DC | PRN
  Administered 2016-07-16 – 2016-07-17 (×3): 1 mg via INTRAVENOUS
  Filled 2016-07-16 (×3): qty 1

## 2016-07-16 MED ORDER — LACTATED RINGERS IV SOLN
INTRAVENOUS | Status: DC | PRN
  Administered 2016-07-16 (×2): via INTRAVENOUS

## 2016-07-16 MED ORDER — MIDAZOLAM HCL 2 MG/2ML IJ SOLN
INTRAMUSCULAR | Status: AC
  Filled 2016-07-16: qty 2

## 2016-07-16 MED ORDER — SODIUM CHLORIDE 0.9 % IJ SOLN
INTRAMUSCULAR | Status: AC
  Filled 2016-07-16: qty 10

## 2016-07-16 MED ORDER — MIDAZOLAM HCL 5 MG/5ML IJ SOLN
INTRAMUSCULAR | Status: DC | PRN
  Administered 2016-07-16: 2 mg via INTRAVENOUS

## 2016-07-16 MED ORDER — DEXAMETHASONE SODIUM PHOSPHATE 10 MG/ML IJ SOLN
INTRAMUSCULAR | Status: AC
  Filled 2016-07-16: qty 1

## 2016-07-16 MED ORDER — CHLORHEXIDINE GLUCONATE 4 % EX LIQD: 60.0000 mL | Freq: Once | CUTANEOUS | Status: DC

## 2016-07-16 MED ORDER — TRANEXAMIC ACID 1000 MG/10ML IV SOLN
1000.0000 mg | INTRAVENOUS | Status: AC
  Administered 2016-07-16: 1000 mg via INTRAVENOUS
  Filled 2016-07-16: qty 1100

## 2016-07-16 MED ORDER — EPHEDRINE SULFATE 50 MG/ML IJ SOLN
INTRAMUSCULAR | Status: AC
  Filled 2016-07-16: qty 1

## 2016-07-16 MED ORDER — MENTHOL 3 MG MT LOZG: 1.0000 | LOZENGE | OROMUCOSAL | Status: DC | PRN

## 2016-07-16 MED ORDER — METOCLOPRAMIDE HCL 5 MG PO TABS: 5.0000 mg | ORAL_TABLET | Freq: Three times a day (TID) | ORAL | Status: DC | PRN

## 2016-07-16 MED ORDER — BUPIVACAINE-EPINEPHRINE (PF) 0.25% -1:200000 IJ SOLN
INTRAMUSCULAR | Status: DC | PRN
  Administered 2016-07-16: 30 mL

## 2016-07-16 MED ORDER — DEXAMETHASONE SODIUM PHOSPHATE 10 MG/ML IJ SOLN
10.0000 mg | Freq: Once | INTRAMUSCULAR | Status: AC
  Administered 2016-07-16: 10 mg via INTRAVENOUS

## 2016-07-16 MED ORDER — CEFAZOLIN SODIUM-DEXTROSE 2-4 GM/100ML-% IV SOLN
2.0000 g | Freq: Four times a day (QID) | INTRAVENOUS | Status: AC
  Administered 2016-07-16 (×2): 2 g via INTRAVENOUS
  Filled 2016-07-16: qty 100

## 2016-07-16 MED ORDER — PROPOFOL 500 MG/50ML IV EMUL
INTRAVENOUS | Status: DC | PRN
  Administered 2016-07-16: 50 ug/kg/min via INTRAVENOUS

## 2016-07-16 MED ORDER — FENTANYL CITRATE (PF) 100 MCG/2ML IJ SOLN
INTRAMUSCULAR | Status: DC | PRN
  Administered 2016-07-16 (×2): 50 ug via INTRAVENOUS

## 2016-07-16 MED ORDER — ALUM & MAG HYDROXIDE-SIMETH 200-200-20 MG/5ML PO SUSP: 30.0000 mL | ORAL | Status: DC | PRN

## 2016-07-16 MED ORDER — SODIUM CHLORIDE 0.9 % IR SOLN
Status: DC | PRN
  Administered 2016-07-16: 1000 mL

## 2016-07-16 MED ORDER — CEFAZOLIN SODIUM-DEXTROSE 2-4 GM/100ML-% IV SOLN
2.0000 g | INTRAVENOUS | Status: AC
  Administered 2016-07-16: 2 g via INTRAVENOUS

## 2016-07-16 MED ORDER — LACTATED RINGERS IV SOLN
INTRAVENOUS | Status: DC
  Administered 2016-07-16: 1000 mL via INTRAVENOUS

## 2016-07-16 MED ORDER — CELECOXIB 200 MG PO CAPS: 200.0000 mg | ORAL_CAPSULE | Freq: Two times a day (BID) | ORAL | 0 refills | Status: AC

## 2016-07-16 MED ORDER — ONDANSETRON HCL 4 MG/2ML IJ SOLN
INTRAMUSCULAR | Status: AC
Start: 2016-07-16 — End: 2016-07-16
  Filled 2016-07-16: qty 2

## 2016-07-16 MED ORDER — PROPOFOL 10 MG/ML IV BOLUS
INTRAVENOUS | Status: AC
  Filled 2016-07-16: qty 60

## 2016-07-16 MED ORDER — MAGNESIUM CITRATE PO SOLN: 1.0000 | Freq: Once | ORAL | Status: DC | PRN

## 2016-07-16 MED ORDER — HYDROCHLOROTHIAZIDE 12.5 MG PO CAPS
12.5000 mg | ORAL_CAPSULE | Freq: Every day | ORAL | Status: DC
  Administered 2016-07-16 – 2016-07-17 (×2): 12.5 mg via ORAL
  Filled 2016-07-16 (×2): qty 1

## 2016-07-16 MED ORDER — METOCLOPRAMIDE HCL 5 MG/ML IJ SOLN
5.0000 mg | Freq: Three times a day (TID) | INTRAMUSCULAR | Status: DC | PRN
  Administered 2016-07-17: 10 mg via INTRAVENOUS
  Filled 2016-07-16: qty 2

## 2016-07-16 MED ORDER — METHOCARBAMOL 1000 MG/10ML IJ SOLN
500.0000 mg | Freq: Four times a day (QID) | INTRAVENOUS | Status: DC | PRN
  Administered 2016-07-16 (×2): 500 mg via INTRAVENOUS
  Filled 2016-07-16 (×3): qty 5
  Filled 2016-07-16: qty 550
  Filled 2016-07-16: qty 5

## 2016-07-16 MED ORDER — FERROUS SULFATE 325 (65 FE) MG PO TABS: 325.0000 mg | ORAL_TABLET | Freq: Three times a day (TID) | ORAL | 3 refills | Status: AC

## 2016-07-16 MED ORDER — METHOCARBAMOL 500 MG PO TABS
500.0000 mg | ORAL_TABLET | Freq: Four times a day (QID) | ORAL | Status: DC | PRN
  Administered 2016-07-17: 500 mg via ORAL
  Filled 2016-07-16: qty 1

## 2016-07-16 MED ORDER — LIDOCAINE 2% (20 MG/ML) 5 ML SYRINGE
INTRAMUSCULAR | Status: DC | PRN
  Administered 2016-07-16: 40 mg via INTRAVENOUS

## 2016-07-16 MED ORDER — FERROUS SULFATE 325 (65 FE) MG PO TABS
325.0000 mg | ORAL_TABLET | Freq: Three times a day (TID) | ORAL | Status: DC
Start: 2016-07-16 — End: 2016-07-17
  Administered 2016-07-17 (×2): 325 mg via ORAL
  Filled 2016-07-16 (×2): qty 1

## 2016-07-16 SURGICAL SUPPLY — 51 items
ATTUNE MED ANAT PAT 35 KNEE (Knees) ×2 IMPLANT
ATTUNE MED ANAT PAT 35MM KNEE (Knees) ×1 IMPLANT
ATTUNE PSFEM LTSZ5 NARCEM KNEE (Femur) ×3 IMPLANT
ATTUNE PSRP INSR SZ5 6 KNEE (Insert) ×2 IMPLANT
ATTUNE PSRP INSR SZ5 6MM KNEE (Insert) ×1 IMPLANT
ATTUNE RPTIB BASE SZ5 CEM KNEE (Knees) ×3 IMPLANT
BAG ZIPLOCK 12X15 (MISCELLANEOUS) ×3 IMPLANT
BANDAGE ACE 6X5 VEL STRL LF (GAUZE/BANDAGES/DRESSINGS) ×3 IMPLANT
BLADE SAW SGTL 13.0X1.19X90.0M (BLADE) ×3 IMPLANT
BOWL SMART MIX CTS (DISPOSABLE) ×3 IMPLANT
CEMENT HV SMART SET (Cement) ×6 IMPLANT
CLOTH BEACON ORANGE TIMEOUT ST (SAFETY) ×3 IMPLANT
CUFF TOURN SGL QUICK 34 (TOURNIQUET CUFF) ×2
CUFF TRNQT CYL 34X4X40X1 (TOURNIQUET CUFF) ×1 IMPLANT
DECANTER SPIKE VIAL GLASS SM (MISCELLANEOUS) ×3 IMPLANT
DRAPE U-SHAPE 47X51 STRL (DRAPES) ×3 IMPLANT
DRESSING AQUACEL AG SP 3.5X10 (GAUZE/BANDAGES/DRESSINGS) ×1 IMPLANT
DRSG AQUACEL AG SP 3.5X10 (GAUZE/BANDAGES/DRESSINGS) ×3
DURAPREP 26ML APPLICATOR (WOUND CARE) ×6 IMPLANT
ELECT REM PT RETURN 9FT ADLT (ELECTROSURGICAL) ×3
ELECTRODE REM PT RTRN 9FT ADLT (ELECTROSURGICAL) ×1 IMPLANT
GLOVE BIO SURGEON STRL SZ 6.5 (GLOVE) ×2 IMPLANT
GLOVE BIO SURGEONS STRL SZ 6.5 (GLOVE) ×1
GLOVE BIOGEL PI IND STRL 7.0 (GLOVE) ×1 IMPLANT
GLOVE BIOGEL PI IND STRL 7.5 (GLOVE) ×4 IMPLANT
GLOVE BIOGEL PI IND STRL 8 (GLOVE) ×1 IMPLANT
GLOVE BIOGEL PI IND STRL 8.5 (GLOVE) ×1 IMPLANT
GLOVE BIOGEL PI INDICATOR 7.0 (GLOVE) ×2
GLOVE BIOGEL PI INDICATOR 7.5 (GLOVE) ×8
GLOVE BIOGEL PI INDICATOR 8 (GLOVE) ×2
GLOVE BIOGEL PI INDICATOR 8.5 (GLOVE) ×2
GLOVE ECLIPSE 8.0 STRL XLNG CF (GLOVE) ×6 IMPLANT
GLOVE ORTHO TXT STRL SZ7.5 (GLOVE) ×6 IMPLANT
GLOVE SURG SS PI 7.5 STRL IVOR (GLOVE) ×3 IMPLANT
GOWN STRL REUS W/TWL LRG LVL3 (GOWN DISPOSABLE) ×6 IMPLANT
GOWN STRL REUS W/TWL XL LVL3 (GOWN DISPOSABLE) ×6 IMPLANT
HANDPIECE INTERPULSE COAX TIP (DISPOSABLE) ×2
LIQUID BAND (GAUZE/BANDAGES/DRESSINGS) ×3 IMPLANT
MANIFOLD NEPTUNE II (INSTRUMENTS) ×3 IMPLANT
PACK TOTAL KNEE CUSTOM (KITS) ×3 IMPLANT
POSITIONER SURGICAL ARM (MISCELLANEOUS) ×3 IMPLANT
SET HNDPC FAN SPRY TIP SCT (DISPOSABLE) ×1 IMPLANT
SET PAD KNEE POSITIONER (MISCELLANEOUS) ×3 IMPLANT
SUT MNCRL AB 4-0 PS2 18 (SUTURE) ×3 IMPLANT
SUT VIC AB 1 CT1 36 (SUTURE) ×6 IMPLANT
SUT VIC AB 2-0 CT1 27 (SUTURE) ×6
SUT VIC AB 2-0 CT1 TAPERPNT 27 (SUTURE) ×3 IMPLANT
SUT VLOC 180 0 24IN GS25 (SUTURE) ×3 IMPLANT
TRAY FOLEY W/METER SILVER 14FR (SET/KITS/TRAYS/PACK) ×3 IMPLANT
WRAP KNEE MAXI GEL POST OP (GAUZE/BANDAGES/DRESSINGS) ×3 IMPLANT
YANKAUER SUCT BULB TIP 10FT TU (MISCELLANEOUS) ×3 IMPLANT

## 2016-07-16 NOTE — Interval H&P Note (Signed)
History and Physical Interval Note:  07/16/2016 8:39 AM  Alexandria [name]  has presented today for surgery, with the diagnosis of LEFT KNEE OA  The various methods of treatment have been discussed with the patient and family. After consideration of risks, benefits and other options for treatment, the patient has consented to  Procedure(s): LEFT TOTAL KNEE ARTHROPLASTY (Left) as a surgical intervention .  The patient's history has been reviewed, patient examined, no change in status, stable for surgery.  I have reviewed the patient's chart and labs.  Questions were answered to the patient's satisfaction.     Mauri Pole

## 2016-07-16 NOTE — Anesthesia Procedure Notes (Signed)
Spinal  Patient location during procedure: OR Preanesthetic Checklist Completed: patient identified, site marked, surgical consent, pre-op evaluation, timeout performed, IV checked, risks and benefits discussed and monitors and equipment checked Spinal Block Patient position: sitting Prep: DuraPrep Patient monitoring: heart rate, cardiac monitor, continuous pulse ox and blood pressure Approach: midline Location: L3-4 Injection technique: single-shot Needle Needle type: Sprotte  Needle gauge: 24 G Needle length: 9 cm Assessment Sensory level: T4 Additional Notes Spinal Dosage in OR  Bupivicaine ml       1.9  LLD x 2 min

## 2016-07-16 NOTE — Anesthesia Preprocedure Evaluation (Addendum)
Anesthesia Evaluation  Patient identified by MRN, date of birth, ID band Patient awake    Reviewed: Allergy & Precautions, H&P , Patient's Chart, lab work & pertinent test results  History of Anesthesia Complications (+) PONV  Airway Mallampati: II  TM Distance: >3 FB Neck ROM: full    Dental no notable dental hx.    Pulmonary former smoker,    Pulmonary exam normal breath sounds clear to auscultation       Cardiovascular Exercise Tolerance: Good hypertension,  Rhythm:regular Rate:Normal     Neuro/Psych    GI/Hepatic GERD  ,  Endo/Other    Renal/GU      Musculoskeletal   Abdominal   Peds  Hematology   Anesthesia Other Findings   Reproductive/Obstetrics                            Anesthesia Physical Anesthesia Plan  ASA: II  Anesthesia Plan: Spinal   Post-op Pain Management:    Induction:   Airway Management Planned:   Additional Equipment:   Intra-op Plan:   Post-operative Plan:   Informed Consent: I have reviewed the patients History and Physical, chart, labs and discussed the procedure including the risks, benefits and alternatives for the proposed anesthesia with the patient or authorized representative who has indicated his/her understanding and acceptance.   Dental Advisory Given  Plan Discussed with: CRNA  Anesthesia Plan Comments: (Lab work confirmed with CRNA in room. Platelets okay. Discussed spinal anesthetic, and patient consents to the procedure:  included risk of possible headache,backache, failed block, allergic reaction, and nerve injury. This patient was asked if she had any questions or concerns before the procedure started. )        Anesthesia Quick Evaluation

## 2016-07-16 NOTE — Discharge Instructions (Signed)

## 2016-07-16 NOTE — Addendum Note (Signed)
Addendum  created 07/16/16 1347 by Talbot Grumbling, CRNA   Charge Capture section accepted

## 2016-07-16 NOTE — Progress Notes (Addendum)
Contacted [employer], given adjusters name of Cory Munch, contacted him at [phone], left VM for him to return my call regarding arrangements for [facility] and DME. If no call back was told to try [phone] which is UR/Auth line. Claim:  [id number]

## 2016-07-16 NOTE — Progress Notes (Signed)
PT Cancellation Note  Patient Details Name: Alexandria [name] MRN: [id number] DOB: July 10, 1961   Cancelled Treatment:    Reason Eval/Treat Not Completed: Medical issues which prohibited therapy (nausea limiting)   Claretha Cooper 07/16/2016, 4:13 PM

## 2016-07-16 NOTE — Progress Notes (Deleted)
PT Cancellation Note  Patient Details Name: Alexandria [name] MRN: [id number] DOB: [birth year]   Cancelled Treatment:    Reason Eval/Treat Not Completed: Medical issues which prohibited therapy (nausea limiting)   Claretha Cooper 07/16/2016, 4:13 PM

## 2016-07-16 NOTE — Anesthesia Postprocedure Evaluation (Signed)
Anesthesia Post Note  Patient: Alexandria [name]  Procedure(s) Performed: Procedure(s) (LRB): LEFT TOTAL KNEE ARTHROPLASTY (Left)  Patient location during evaluation: PACU Anesthesia Type: Spinal Level of consciousness: awake Pain management: satisfactory to patient Vital Signs Assessment: post-procedure vital signs reviewed and stable Respiratory status: spontaneous breathing Cardiovascular status: blood pressure returned to baseline Postop Assessment: no headache and spinal receding Anesthetic complications: no    Last Vitals:  Vitals:   07/16/16 1155 07/16/16 1207  BP: 133/86 116/75  Pulse: 73 67  Resp: 14 12  Temp: 36.8 C 36.6 C    Last Pain:  Vitals:   07/16/16 1242  TempSrc:   PainSc: [place]

## 2016-07-16 NOTE — Progress Notes (Signed)
Called [phone] which is UR/Auth line. [id number] to speak with rep. Asked to call the [place] at [phone], spoke with Gerald Stabs who got me in contact with [facility]) [place]. We were told to contact [employer] at [phone] for DME, I called and spoke with Abby, she asked me to fax orders for DME to [facility] at [phone]. I then contacted [employer] @ [phone] to arrange PT, referral # [id number], faxed orders to [phone]. Per [employer] they will f/u with the patient regarding agency and start of service, requested start of service for 8/29.

## 2016-07-16 NOTE — Op Note (Signed)
NAME:  Alexandria [name] RECORD NO.:  [id number]                             FACILITY:  [facility]      PHYSICIAN:  Pietro Cassis. Alvan Dame, M.D.  DATE OF BIRTH:  Mar 21, 1961      DATE OF PROCEDURE:  07/16/2016                                     OPERATIVE REPORT         PREOPERATIVE DIAGNOSIS:  Left knee osteoarthritis.      POSTOPERATIVE DIAGNOSIS:  Left knee osteoarthritis.      FINDINGS:  The patient was noted to have complete loss of cartilage and   bone-on-bone arthritis with associated osteophytes in all three compartments of   the knee with a significant synovitis and associated effusion.      PROCEDURE:  Left total knee replacement.      COMPONENTS USED:  DePuy Attune rotating platform posterior stabilized knee   system, a size 5N femur, 5 tibia, size 6 PS AOXinsert, and 35 anatomic patellar   button.      SURGEON:  Pietro Cassis. Alvan Dame, M.D.      ASSISTANT:  Danae Orleans, PA-C.      ANESTHESIA:  Spinal.      SPECIMENS:  None.      COMPLICATION:  None.      DRAINS:  None.  EBL: <50cc      TOURNIQUET TIME:   Total Tourniquet Time Documented: Thigh (Left) - 32 minutes Total: Thigh (Left) - 32 minutes  .      The patient was stable to the recovery room.      INDICATION FOR PROCEDURE:  Alexandria [name] is a 55 y.o. female patient of   mine.  The patient had been seen, evaluated, and treated conservatively in the   office with medication, activity modification, and injections.  The patient had   radiographic changes of bone-on-bone arthritis with endplate sclerosis and osteophytes noted.      The patient failed conservative measures including medication, injections, and activity modification, and at this point was ready for more definitive measures.   Based on the radiographic changes and failed conservative measures, the patient   decided to proceed with total knee replacement.  Risks of infection,   DVT, component failure, need for revision  surgery, postop course, and   expectations were all   discussed and reviewed.  Consent was obtained for benefit of pain   relief.      PROCEDURE IN DETAIL:  The patient was brought to the operative theater.   Once adequate anesthesia, preoperative antibiotics, 2 gm of Ancef, 1 gm of Tranexamic Acid, and 10 mg of Decadron administered, the patient was positioned supine with the left thigh tourniquet placed.  The  left lower extremity was prepped and draped in sterile fashion.  A time-   out was performed identifying the patient, planned procedure, and   extremity.      The left lower extremity was placed in the [facility] leg holder.  The leg was   exsanguinated, tourniquet elevated to 250 mmHg.  A midline incision was   made followed by median parapatellar  arthrotomy.  Following initial   exposure, attention was first directed to the patella.  Precut   measurement was noted to be 25 mm.  I resected down to 14 mm and used a   35 anatomic patellar button to restore patellar height as well as cover the cut   surface.      The lug holes were drilled and a metal shim was placed to protect the   patella from retractors and saw blades.      At this point, attention was now directed to the femur.  The femoral   canal was opened with a drill, irrigated to try to prevent fat emboli.  An   intramedullary rod was passed at 3 degrees valgus, 8 mm of bone was   resected off the distal femur.  Following this resection, the tibia was   subluxated anteriorly.  Using the extramedullary guide, 2 mm of bone was resected off   the proximal medial tibia.  We confirmed the gap would be   stable medially and laterally with a size gap insert as well as confirmed   the cut was perpendicular in the coronal plane, checking with an alignment rod.      Once this was done, I sized the femur to be a size 5 in the anterior-   posterior dimension, chose a narrow component based on medial and   lateral dimension.  The  size 5 rotation block was then pinned in   position anterior referenced using the C-clamp to set rotation.  The   anterior, posterior, and  chamfer cuts were made without difficulty nor   notching making certain that I was along the anterior cortex to help   with flexion gap stability.      The final box cut was made off the lateral aspect of distal femur.      At this point, the tibia was sized to be a size 5, the size 5 tray was   then pinned in position through the medial third of the tubercle,   drilled, and keel punched.  Trial reduction was now carried with a 5 femur,  5 tibia, a size 6 insert, and the 35 anatomic patella botton.  The knee was brought to   extension, full extension with good flexion stability with the patella   tracking through the trochlea without application of pressure.  Given   all these findings the femoral lug holes were drilled then the trial components removed.  Final components were   opened and cement was mixed.  The knee was irrigated with normal saline   solution and pulse lavage.  The synovial lining was   then injected with 30 cc of 0.25% Marcaine with epinephrine and 1 cc of Toradol plus 30 cc of NS for a   total of 61 cc.      The knee was irrigated.  Final implants were then cemented onto clean and   dried cut surfaces of bone with the knee brought to extension with a size 6 trial insert.      Once the cement had fully cured, the excess cement was removed   throughout the knee.  I confirmed I was satisfied with the range of   motion and stability, and the final size 6 PS AOX insert was chosen.  It was   placed into the knee.      The tourniquet had been let down at 32 minutes.  No significant   hemostasis required.  The   extensor mechanism was then reapproximated using #1 Vicryl and #0 V-lock sutures with the knee   in flexion.  The   remaining wound was closed with 2-0 Vicryl and running 4-0 Monocryl.   The knee was cleaned, dried, dressed  sterilely using Dermabond and   Aquacel dressing.  The patient was then   brought to recovery room in stable condition, tolerating the procedure   well.   Please note that Physician Assistant, Danae Orleans, PA-C, was present for the entirety of the case, and was utilized for pre-operative positioning, peri-operative retractor management, general facilitation of the procedure.  He was also utilized for primary wound closure at the end of the case.              Pietro Cassis Alvan Dame, M.D.    07/16/2016 10:55 AM

## 2016-07-16 NOTE — Transfer of Care (Signed)
Immediate Anesthesia Transfer of Care Note  Patient: Alexandria [name]  Procedure(s) Performed: Procedure(s): LEFT TOTAL KNEE ARTHROPLASTY (Left)  Patient Location: PACU  Anesthesia Type:Spinal  Level of Consciousness:  sedated, patient cooperative and responds to stimulation  Airway & Oxygen Therapy:Patient Spontanous Breathing and Patient connected to face mask oxgen  Post-op Assessment:  Report given to PACU RN and Post -op Vital signs reviewed and stable  Post vital signs:  Reviewed and stable  Last Vitals:  Vitals:   07/16/16 0728  BP: (!) 149/78  Pulse: 66  Resp: 18  Temp: 123XX123 C    Complications: No apparent anesthesia complications

## 2016-07-17 LAB — BASIC METABOLIC PANEL
Anion gap: 4 — ABNORMAL LOW (ref 5–15)
BUN: 14 mg/dL (ref 6–20)
CALCIUM: 8.9 mg/dL (ref 8.9–10.3)
CO2: 28 mmol/L (ref 22–32)
CREATININE: 0.73 mg/dL (ref 0.44–1.00)
Chloride: 106 mmol/L (ref 101–111)
GFR calc non Af Amer: >60 mL/min (ref >60)
Glucose, Bld: 148 mg/dL — ABNORMAL HIGH (ref 65–99)
Potassium: 4.9 mmol/L (ref 3.5–5.1)
SODIUM: 138 mmol/L (ref 135–145)

## 2016-07-17 LAB — CBC
HCT: 33.8 % — ABNORMAL LOW (ref 36.0–46.0)
Hemoglobin: 11.3 g/dL — ABNORMAL LOW (ref 12.0–15.0)
MCH: 29.8 pg (ref 26.0–34.0)
MCHC: 33.4 g/dL (ref 30.0–36.0)
MCV: 89.2 fL (ref 78.0–100.0)
Platelets: 280 10*3/uL (ref 150–400)
RBC: 3.79 MIL/uL — ABNORMAL LOW (ref 3.87–5.11)
RDW: 13.8 % (ref 11.5–15.5)
WBC: 15.1 10*3/uL — ABNORMAL HIGH (ref 4.0–10.5)

## 2016-07-17 MED ORDER — ONDANSETRON HCL 4 MG PO TABS: 4.0000 mg | ORAL_TABLET | Freq: Three times a day (TID) | ORAL | 0 refills | Status: AC | PRN

## 2016-07-17 NOTE — Evaluation (Signed)
Physical Therapy Evaluation Patient Details Name: Alexandria [name] MRN: [id number] DOB: 1960-11-24 Today's Date: 07/17/2016   History of Present Illness  s/p L TKA  Clinical Impression  Pt is s/p TKA resulting in the deficits listed below (see PT Problem List).   Pt will benefit from skilled PT to increase their independence and safety with mobility to allow discharge to the venue listed below.      Follow Up Recommendations Home health PT    Equipment Recommendations  None recommended by PT    Recommendations for Other Services       Precautions / Restrictions Precautions Precautions: Knee Restrictions Weight Bearing Restrictions: No Other Position/Activity Restrictions: WBAT      Mobility  Bed Mobility Overal bed mobility: Needs Assistance Bed Mobility: Supine to Sit     Supine to sit: Supervision     General bed mobility comments: for safety  Transfers Overall transfer level: Needs assistance Equipment used: Rolling walker (2 wheeled) Transfers: Sit to/from Stand Sit to Stand: Min guard         General transfer comment: cues for hand placement  Ambulation/Gait Ambulation/Gait assistance: Min guard;Min assist Ambulation Distance (Feet): 50 Feet Assistive device: Rolling walker (2 wheeled) Gait Pattern/deviations: Step-to pattern;Antalgic     General Gait Details: cues for sequence and RW position  Stairs            Wheelchair Mobility    Modified Rankin (Stroke Patients Only)       Balance                                             Pertinent Vitals/Pain Pain Assessment: 0-10 Pain Score: 2  Pain Location: L knee Pain Descriptors / Indicators: Sore Pain Intervention(s): Limited activity within patient's tolerance;Monitored during session;Premedicated before session    Home Living Family/patient expects to be discharged to:: Private residence Living Arrangements: Children Available Help at Discharge:  Family;Available 24 hours/day Type of Home: House Home Access: Stairs to enter   [employer] of Steps: 4 Home Layout: One level;Laundry or work area in [employer]: None      Prior Function Level of Independence: Independent               Journalist, newspaper        Extremity/Trunk Assessment   Upper Extremity Assessment: Defer to OT evaluation           Lower Extremity Assessment: LLE deficits/detail   LLE Deficits / Details: ankle WFL; knee extension and hip flexion grossly 2+ to 3/5; knee AAROM grossly 5* to 65*     Communication   Communication: No difficulties  Cognition Arousal/Alertness: Awake/alert Behavior During Therapy: WFL for tasks assessed/performed Overall Cognitive Status: Within Functional Limits for tasks assessed                      General Comments      Exercises Total Joint Exercises Ankle Circles/Pumps: AROM;Both;10 reps Quad Sets: AROM;10 reps;Both      Assessment/Plan    PT Assessment Patient needs continued PT services  PT Diagnosis Difficulty walking   PT Problem List Decreased strength;Decreased range of motion;Decreased mobility;Decreased knowledge of precautions  PT Treatment Interventions DME instruction;Gait training;Stair training;Functional mobility training;Therapeutic exercise;Therapeutic activities;Patient/family education   PT Goals (Current goals can be found in the Care Plan section) Acute Rehab PT Goals  Patient Stated Goal: home today if possible PT Goal Formulation: With patient Time For Goal Achievement: 07/19/16 Potential to Achieve Goals: Good    Frequency 7X/week   Barriers to discharge        Co-evaluation               End of Session Equipment Utilized During Treatment: Gait belt Activity Tolerance: Patient tolerated treatment well Patient left: in chair;with call bell/phone within reach;with chair alarm set           Time: [id number] PT Time Calculation  (min) (ACUTE ONLY): 31 min   Charges:   PT Evaluation $PT Eval Low Complexity: 1 Procedure PT Treatments $Gait Training: 8-22 mins   PT G Codes:        Kaden Dunkel 07/31/16, 12:36 PM

## 2016-07-17 NOTE — Progress Notes (Signed)
3in1 and RW delivered to room by DME rep. Marney Doctor RN,BSN,NCM [phone]

## 2016-07-17 NOTE — Progress Notes (Signed)
Occupational Therapy Evaluation Patient Details Name: Alexandria [name] MRN: [id number] DOB: 10/11/1961 Today's Date: 07/17/2016    History of Present Illness s/p L TKA   Clinical Impression   All OT education completed and pt questions answered. No further OT needs at this time. Will sign off.    Follow Up Recommendations  No OT follow up    Equipment Recommendations  3 in 1 bedside comode    Recommendations for Other Services       Precautions / Restrictions Precautions Precautions: Knee Restrictions Weight Bearing Restrictions: No Other Position/Activity Restrictions: WBAT      Mobility Bed Mobility Overal bed mobility: Needs Assistance Bed Mobility: Supine to Sit;Sit to Supine     Supine to sit: Supervision Sit to supine: Min assist   General bed mobility comments: with LLE  Transfers Overall transfer level: Needs assistance Equipment used: Rolling walker (2 wheeled) Transfers: Sit to/from Stand Sit to Stand: Supervision         General transfer comment: cues for hand placement    Balance                                            ADL Overall ADL's : Needs assistance/impaired Eating/Feeding: Independent;Sitting   Grooming: Set up;Sitting           Upper Body Dressing : Set up;Sitting   Lower Body Dressing: Supervision/safety;Sit to/from stand Lower Body Dressing Details (indicate cue type and reason): educated on LB dressing techniques (start in sitting, dress operated leg first) Toilet Transfer: Supervision/safety;Ambulation;BSC;RW   Toileting- Clothing Manipulation and Hygiene: Supervision/safety;Sit to/from stand   Tub/ Shower Transfer: Tub Product manager Details (indicate cue type and reason): verbal education and demo on tub transfer technique; pt declined to practice. Educated to have assistance with this task. Functional mobility during ADLs: Supervision/safety;Rolling walker       Vision      Perception     Praxis      Pertinent Vitals/Pain Pain Assessment: Faces Faces Pain Scale: Hurts a little bit Pain Location: L knee Pain Descriptors / Indicators: Sore Pain Intervention(s): Premedicated before session;Repositioned;Ice applied     Hand Dominance     Extremity/Trunk Assessment Upper Extremity Assessment Upper Extremity Assessment: Overall WFL for tasks assessed   Lower Extremity Assessment Lower Extremity Assessment: Defer to PT evaluation LLE Deficits / Details: ankle WFL; knee extension and hip flexion grossly 2+ to 3/5; knee AAROM grossly 5* to 65*       Communication Communication Communication: No difficulties   Cognition Arousal/Alertness: Awake/alert Behavior During Therapy: WFL for tasks assessed/performed Overall Cognitive Status: Within Functional Limits for tasks assessed                     General Comments       Exercises       Shoulder Instructions      Home Living Family/patient expects to be discharged to:: Private residence Living Arrangements: Children Available Help at Discharge: Family;Available 24 hours/day Type of Home: House Home Access: Stairs to enter [employer] of Steps: 4   Home Layout: One level;Laundry or work area in basement     [employer] Shower/Tub: Teacher, 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: [employer]: None   Additional Comments: likes to ride horses and ride her motorcycle      Prior Functioning/Environment Level  of Independence: Independent             OT Diagnosis: Acute pain   OT Problem List: Decreased strength;Decreased range of motion;Decreased knowledge of use of DME or AE;Pain   OT Treatment/Interventions:      OT Goals(Current goals can be found in the care plan section) Acute Rehab OT Goals Patient Stated Goal: home today if possible OT Goal Formulation: All assessment and education complete, DC therapy  OT Frequency:     Barriers to D/C:             Co-evaluation              End of Session Equipment Utilized During Treatment: Rolling walker Nurse Communication: Mobility status  Activity Tolerance: Patient tolerated treatment well Patient left: in bed;with call bell/phone within reach;with family/visitor present   Time: [id number] OT Time Calculation (min): 12 min Charges:  OT General Charges $OT Visit: 1 Procedure OT Evaluation $OT Eval Low Complexity: 1 Procedure G-Codes:    Alexandria [name] A July 18, 2016, 3:27 PM

## 2016-07-17 NOTE — Progress Notes (Signed)
Patient ID: NEILE NUDING, female   DOB: Jul 24, 1961, 55 y.o.   MRN: [id number] Subjective: 1 Day Post-Op Procedure(s) (LRB): LEFT TOTAL KNEE ARTHROPLASTY (Left)    Patient reports pain as moderate.  Main issue was narcotic induced nausea yesterday.  Better controlled at this point.  Reviewed findings and procedure  Objective:   VITALS:   Vitals:   07/17/16 0146 07/17/16 0528  BP: 138/78 130/77  Pulse: 75 75  Resp: 12 16  Temp: 98.8 F (37.1 C) 98.3 F (36.8 C)    Neurovascular intact Incision: dressing C/D/I  LABS  Recent Labs  07/17/16 0433  HGB 11.3*  HCT 33.8*  WBC 15.1*  PLT 280     Recent Labs  07/17/16 0433  NA 138  K 4.9  BUN 14  CREATININE 0.73  GLUCOSE 148*    No results for input(s): LABPT, INR in the last 72 hours.   Assessment/Plan: 1 Day Post-Op Procedure(s) (LRB): LEFT TOTAL KNEE ARTHROPLASTY (Left)   Advance diet Up with therapy Discharge home with home health after therapy  D/C with pain meds with antiemetics  RTC in 2 weeks

## 2016-07-17 NOTE — Progress Notes (Signed)
   07/17/16 1400  PT Visit Information  Last PT Received On 07/17/16  Assistance Needed +1  History of Present Illness s/p L TKA  Subjective Data  Patient Stated Goal home today if possible  Precautions  Precautions Knee  Restrictions  Weight Bearing Restrictions No  Other Position/Activity Restrictions WBAT  Pain Assessment  Pain Assessment Faces  Faces Pain Scale 2  Pain Location L knee  Pain Descriptors / Indicators Sore  Pain Intervention(s) Limited activity within patient's tolerance;Monitored during session;Premedicated before session;Ice applied  Cognition  Arousal/Alertness Awake/alert  Behavior During Therapy WFL for tasks assessed/performed  Overall Cognitive Status Within Functional Limits for tasks assessed  Bed Mobility  Overal bed mobility Needs Assistance  Bed Mobility Supine to Sit;Sit to Supine  Supine to sit Min assist;Min guard  Sit to supine Min guard;Min assist  General bed mobility comments with LLE  Transfers  Overall transfer level Needs assistance  Equipment used Rolling walker (2 wheeled)  Transfers Sit to/from Stand  Sit to Stand Min guard  General transfer comment cues for hand placement  Ambulation/Gait  Ambulation/Gait assistance Min guard  Ambulation Distance (Feet) 60 Feet  Assistive device Rolling walker (2 wheeled)  Gait Pattern/deviations Step-to pattern  General Gait Details cues for sequence and RW position  Stairs Yes  Stairs assistance Min guard  Stair Management One rail Right;Step to pattern  Number of Stairs 2  General stair comments cues for sequence  Total Joint Exercises  Ankle Circles/Pumps AROM;Both;10 reps  Quad Sets AROM;10 reps;Both  Heel Slides AAROM;Left;10 reps  Hip ABduction/ADduction AAROM;AROM;Strengthening;Left;10 reps  Straight Leg Raises AAROM;AROM;Left;10 reps  PT - End of Session  Activity Tolerance Patient tolerated treatment well  Patient left in bed;with call bell/phone within reach;with  family/visitor present  PT - Assessment/Plan  PT Plan Current plan remains appropriate  PT Frequency (ACUTE ONLY) 7X/week  Follow Up Recommendations Home health PT  PT equipment None recommended by PT  PT Goal Progression  Progress towards PT goals Progressing toward goals  Acute Rehab PT Goals  PT Goal Formulation With patient  Time For Goal Achievement 07/19/16  Potential to Achieve Goals Good  PT Time Calculation  PT Start Time (ACUTE ONLY) 1431  PT Stop Time (ACUTE ONLY) 1447  PT Time Calculation (min) (ACUTE ONLY) 16 min  PT General Charges  $$ ACUTE PT VISIT 1 Procedure  PT Treatments  $Gait Training 8-22 mins

## 2016-07-29 NOTE — Discharge Summary (Signed)
Physician Discharge Summary  Patient ID: Alexandria [name] MRN: [id number] DOB/AGE: [birth year] 55 y.o.  Admit date: 07/16/2016 Discharge date: 07/17/2016   Procedures:  Procedure(s) (LRB): LEFT TOTAL KNEE ARTHROPLASTY (Left)  Attending Physician:  Dr. Paralee Cancel   Admission Diagnoses:   Left knee primary OA / pain  Discharge Diagnoses:  Active Problems:   S/P left TKA   S/P knee replacement  Past Medical History:  Diagnosis Date  . Arthritis   . Complication of anesthesia   . Dysrhythmia   . GERD (gastroesophageal reflux disease)   . Hypertension   . Mitral valve prolapse   . Pneumonia    as child  . PONV (postoperative nausea and vomiting)   . Skin cancer 2014   2 spots labeled "pre-melanoma"    HPI:    Alexandria [name], 55 y.o. female, has a history of pain and functional disability in the left knee due to trauma and arthritis and has failed non-surgical conservative treatments for greater than 12 weeks to include NSAID's and/or analgesics, corticosteriod injections, viscosupplementation injections, use of assistive devices and activity modification.  Onset of symptoms was gradual, starting ~3 years ago (incident occurred Nov 04, 2013)  with gradually worsening course since that time. The patient noted prior procedures on the knee to include  arthroscopy and microfracture on the left knee(s).  Patient currently rates pain in the left knee(s) at 10 out of 10 with activity. Patient has night pain, worsening of pain with activity and weight bearing, pain that interferes with activities of daily living, pain with passive range of motion, crepitus and joint swelling.  Patient has evidence of periarticular osteophytes and joint space narrowing by imaging studies.  There is no active infection.   Risks, benefits and expectations were discussed with the patient.  Risks including but not limited to the risk of anesthesia, blood clots, nerve damage, blood vessel damage, failure of the  prosthesis, infection and up to and including death.  Patient understand the risks, benefits and expectations and wishes to proceed with surgery.   PCP: Griffith Citron, NP   Discharged Condition: good  Hospital Course:  Patient underwent the above stated procedure on 07/16/2016. Patient tolerated the procedure well and brought to the recovery room in good condition and subsequently to the floor.  POD #1 BP: 130/77 ; Pulse: 75 ; Temp: 98.3 F (36.8 C) ; Resp: 16 Patient reports pain as moderate.  Main issue was narcotic induced nausea yesterday.  Better controlled at this point.nReviewed findings and procedure. Neurovascular intact and incision: dressing C/D/I  LABS  Basename    HGB     11.3  HCT     33.8    Discharge Exam: General appearance: alert, cooperative and no distress Extremities: Homans sign is negative, no sign of DVT, no edema, redness or tenderness in the calves or thighs and no ulcers, gangrene or trophic changes  Disposition: Home with follow up in 2 weeks   Follow-up Information    Mauri Pole, MD. Schedule an appointment as soon as possible for a visit in 2 week(s).   Specialty:  Orthopedic Surgery Contact information: [address] [place] [address] [id number]           Discharge Instructions    Call MD / Call 911    Complete by:  As directed   If you experience chest pain or shortness of breath, CALL 911 and be transported to the hospital emergency room.  If you develope a  fever above 101 F, pus (white drainage) or increased drainage or redness at the wound, or calf pain, call your surgeon's office.   Change dressing    Complete by:  As directed   Maintain surgical dressing until follow up in the clinic. If the edges start to pull up, may reinforce with tape. If the dressing is no longer working, may remove and cover with gauze and tape, but must keep the area dry and clean.  Call with any questions or concerns.   Constipation  Prevention    Complete by:  As directed   Drink plenty of fluids.  Prune juice may be helpful.  You may use a stool softener, such as Colace (over the counter) 100 mg twice a day.  Use MiraLax (over the counter) for constipation as needed.   Diet - low sodium heart healthy    Complete by:  As directed   Discharge instructions    Complete by:  As directed   Maintain surgical dressing until follow up in the clinic. If the edges start to pull up, may reinforce with tape. If the dressing is no longer working, may remove and cover with gauze and tape, but must keep the area dry and clean.  Follow up in 2 weeks at [facility]. Call with any questions or concerns.   Increase activity slowly as tolerated    Complete by:  As directed   Weight bearing as tolerated with assist device (walker, cane, etc) as directed, use it as long as suggested by your surgeon or therapist, typically at least 4-6 weeks.   TED hose    Complete by:  As directed   Use stockings (TED hose) for 2 weeks on both leg(s).  You may remove them at night for sleeping.        Medication List    TAKE these medications   aspirin 81 MG chewable tablet Chew 1 tablet (81 mg total) by mouth 2 (two) times daily. Take for 4 weeks.   celecoxib 200 MG capsule Commonly known as:  CELEBREX Take 1 capsule (200 mg total) by mouth every 12 (twelve) hours.   diphenhydrAMINE 12.5 MG/5ML liquid Commonly known as:  BENADRYL Take 6.25 mg by mouth 4 (four) times daily as needed for allergies (alpa gal).   docusate sodium 100 MG capsule Commonly known as:  COLACE Take 1 capsule (100 mg total) by mouth 2 (two) times daily.   ferrous sulfate 325 (65 FE) MG tablet Take 1 tablet (325 mg total) by mouth 3 (three) times daily after meals.   hydrochlorothiazide 12.5 MG capsule Commonly known as:  MICROZIDE Take 12.5 mg by mouth daily.   HYDROcodone-acetaminophen 7.5-325 MG tablet Commonly known as:  NORCO Take 1-2 tablets by mouth  every 4 (four) hours as needed for moderate pain.   methocarbamol 500 MG tablet Commonly known as:  ROBAXIN Take 1 tablet (500 mg total) by mouth every 6 (six) hours as needed for muscle spasms.   ondansetron 4 MG tablet Commonly known as:  ZOFRAN Take 1 tablet (4 mg total) by mouth every 8 (eight) hours as needed for nausea or vomiting.   pantoprazole 40 MG tablet Commonly known as:  PROTONIX Take 40 mg by mouth daily.   polyethylene glycol packet Commonly known as:  MIRALAX / GLYCOLAX Take 17 g by mouth 2 (two) times daily.   sulfamethoxazole-trimethoprim 800-160 MG tablet Commonly known as:  BACTRIM DS,SEPTRA DS Take 1 tablet by mouth daily.  Signed: [place] Pugh. Emiko Osorto   PA-C  07/29/2016, 3:33 PM

## 2019-01-01 ENCOUNTER — Other Ambulatory Visit: Payer: Self-pay | Admitting: Obstetrics

## 2019-01-01 DIAGNOSIS — R1010 Upper abdominal pain, unspecified: Secondary | ICD-10-CM

## 2019-01-07 ENCOUNTER — Other Ambulatory Visit: Payer: Self-pay

## 2019-01-20 ENCOUNTER — Other Ambulatory Visit: Payer: Self-pay

## 2019-01-26 ENCOUNTER — Ambulatory Visit
Admission: RE | Admit: 2019-01-26 | Discharge: 2019-01-26 | Disposition: A | Discharge status: Home / Self Care | Payer: BLUE CROSS/BLUE SHIELD | Source: Ambulatory Visit | Attending: Obstetrics | Admitting: Obstetrics

## 2019-01-26 DIAGNOSIS — R1010 Upper abdominal pain, unspecified: Secondary | ICD-10-CM

## 2020-03-02 IMAGING — US ULTRASOUND ABDOMEN COMPLETE
1 series · 13 of 25 positions shown · non-contrast
Comparison: None.

CLINICAL DATA: Upper abdominal pain

EXAM:
ABDOMEN ULTRASOUND COMPLETE

[Series 1: ultrasound abdomen complete · 0.17mm/px · 13 of 97 slices shown]
[im 1/97]
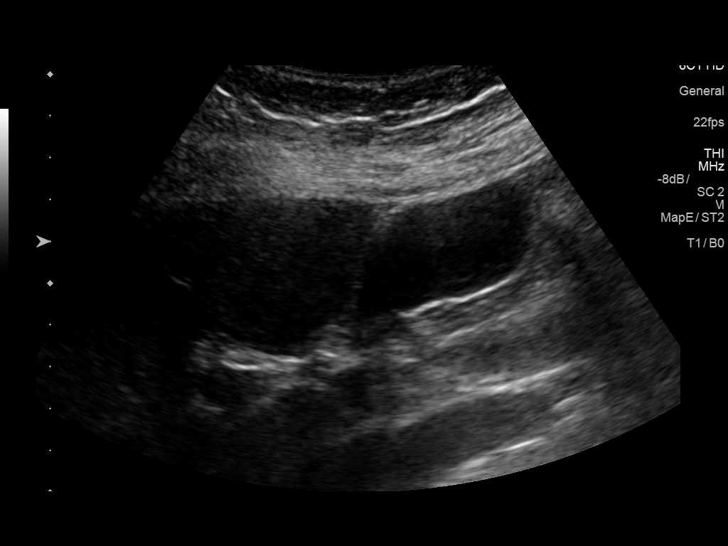
[im 9/97]
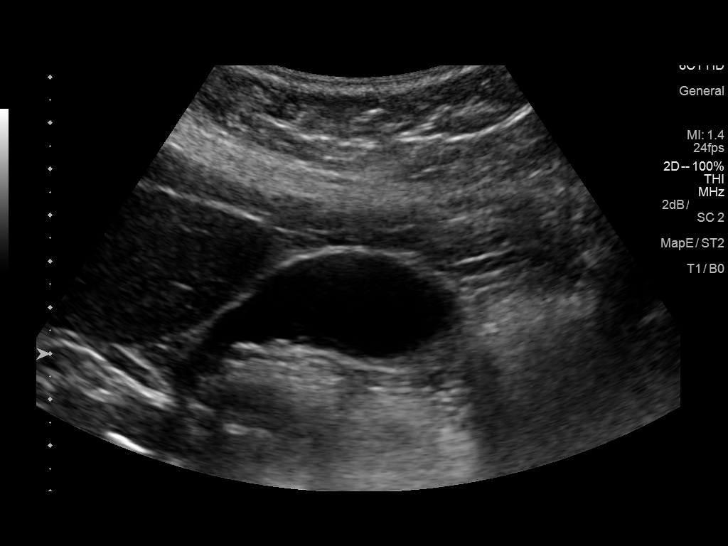
[im 17/97]
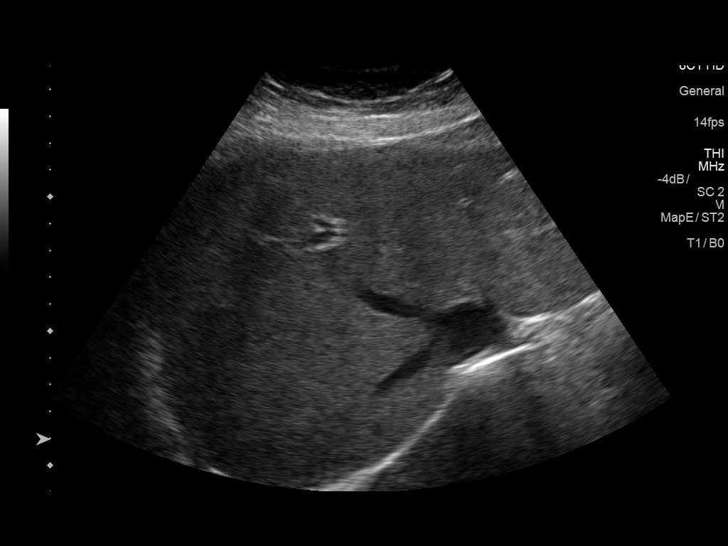
[im 25/97]
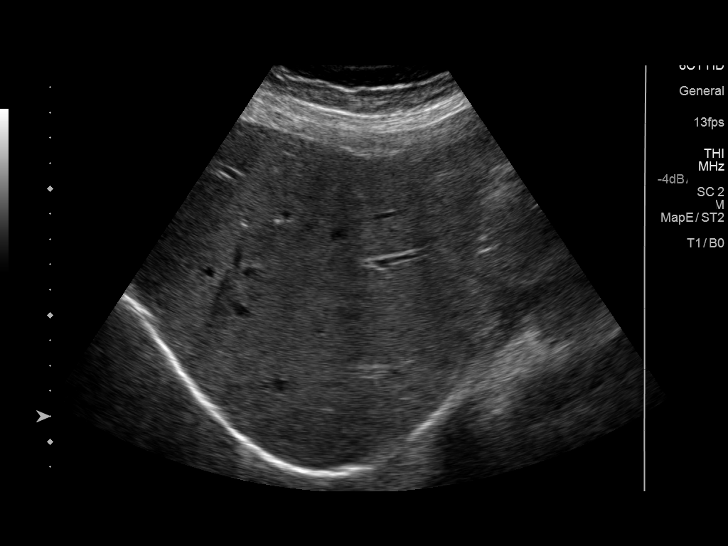
[im 33/97]
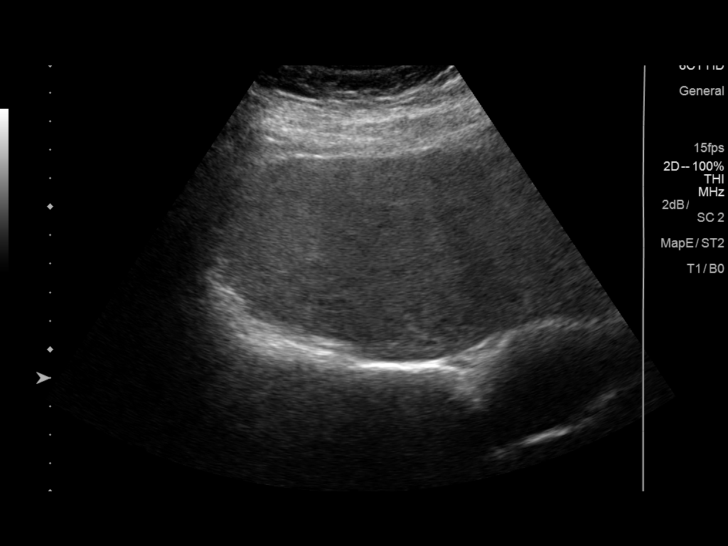
[im 41/97]
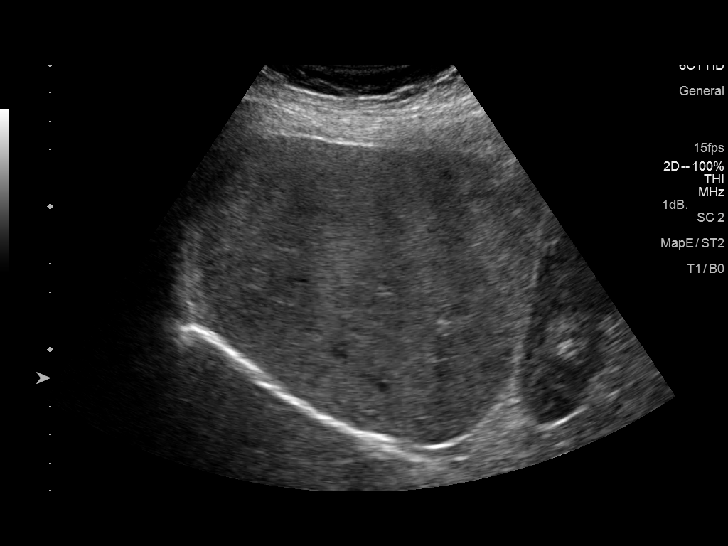
[im 49/97]
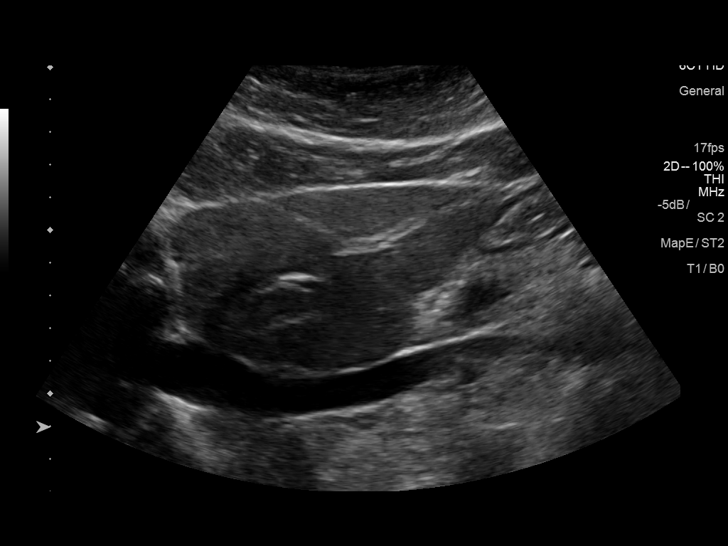
[im 57/97]
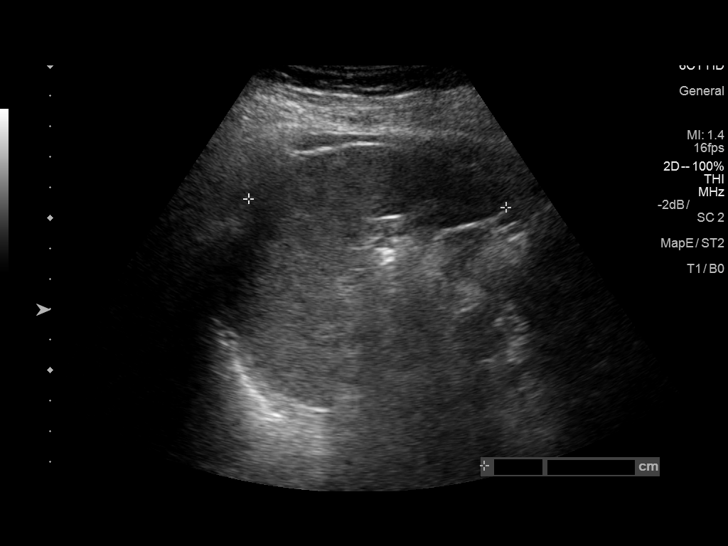
[im 65/97]
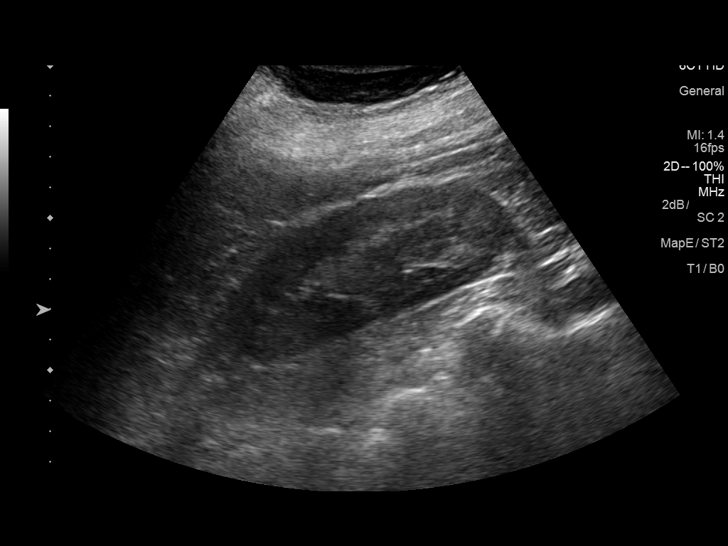
[im 73/97]
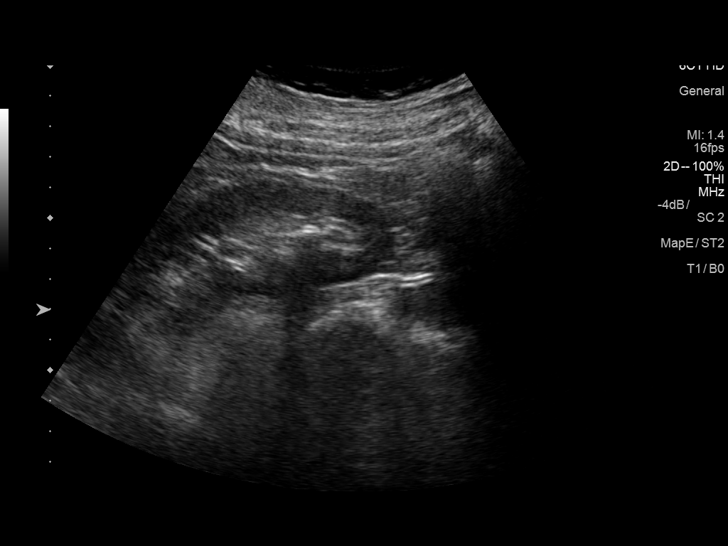
[im 81/97]
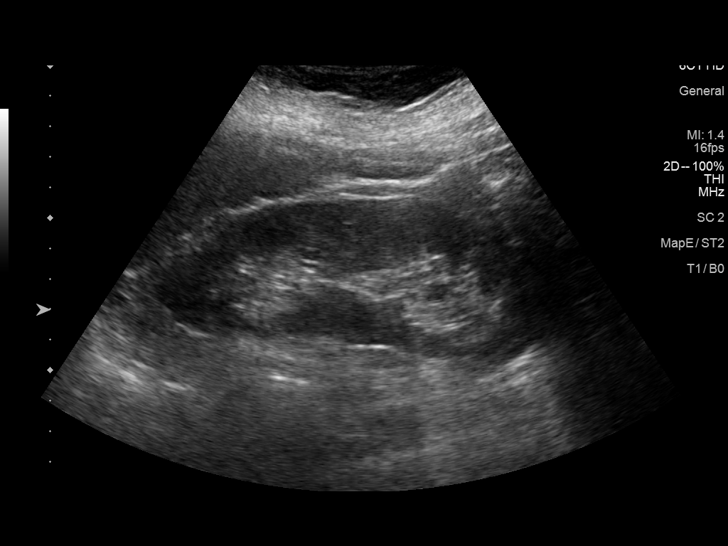
[im 89/97]
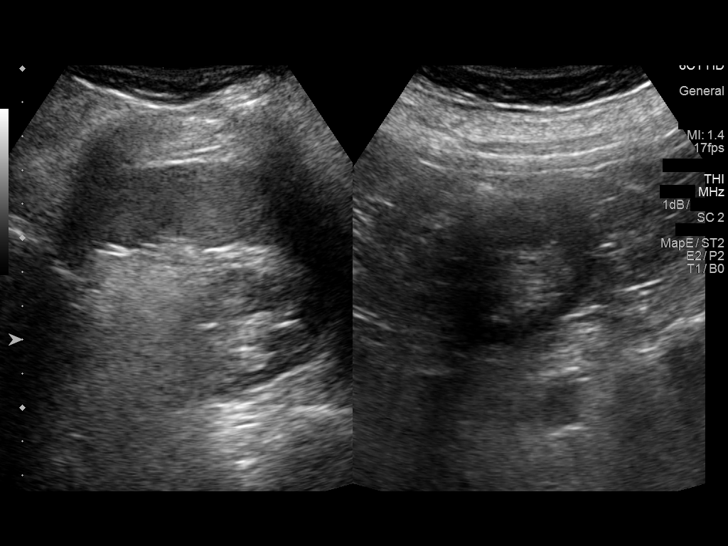
[im 97/97]
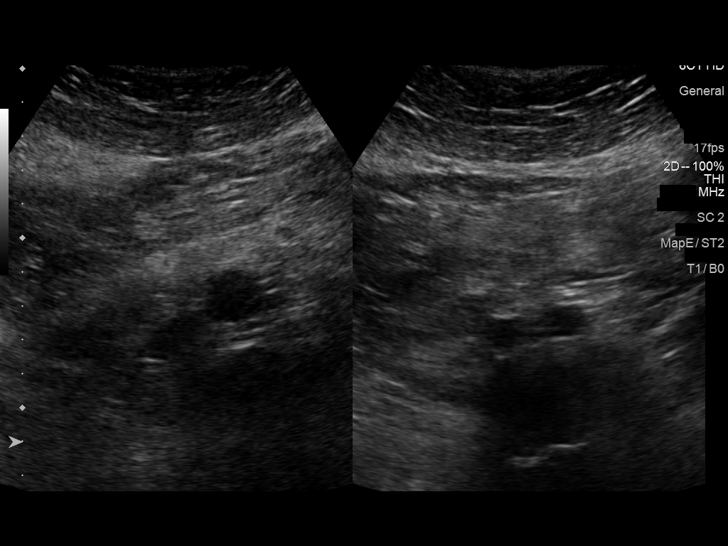

[13 of 25 positions shown; findings below may reference images not displayed]

FINDINGS: Gallbladder: No gallstones or wall thickening visualized. No
sonographic Murphy sign noted by sonographer.

Common bile duct: Diameter: 3.4 mm.

Liver: 9 mm hyperechoic focus is noted in the right lobe of the
liver consistent with small hemangioma. Portal vein is patent on
color Doppler imaging with normal direction of blood flow towards
the liver.

IVC: No abnormality visualized.

Pancreas: Visualized portion unremarkable.

Spleen: Size and appearance within normal limits.

Right Kidney: Length: 12.1 cm. Echogenic focus in the lower pole
with posterior acoustical shadowing. This measures almost 14 mm in
greatest dimension. No obstructive changes are seen.

Left Kidney: Length: 13 cm. Echogenicity within normal limits. No
mass or hydronephrosis visualized.

Abdominal aorta: No aneurysm visualized.

Other findings: None.
IMPRESSION: Hyperechoic focus within the right lobe of the liver likely
representing a small hemangioma.

Echogenic focus within the lower pole of the right kidney which is
consistent with nonobstructing stone.

No other focal abnormality is noted.

## 2021-05-16 ENCOUNTER — Other Ambulatory Visit (HOSPITAL_COMMUNITY): Payer: Self-pay | Admitting: Urology

## 2021-05-16 ENCOUNTER — Other Ambulatory Visit: Payer: Self-pay | Admitting: Urology

## 2021-05-16 DIAGNOSIS — N200 Calculus of kidney: Secondary | ICD-10-CM

## 2021-05-24 ENCOUNTER — Other Ambulatory Visit: Payer: Self-pay | Admitting: Urology

## 2021-05-24 DIAGNOSIS — R932 Abnormal findings on diagnostic imaging of liver and biliary tract: Secondary | ICD-10-CM

## 2021-06-06 ENCOUNTER — Other Ambulatory Visit: Payer: Self-pay

## 2021-06-06 ENCOUNTER — Ambulatory Visit
Admission: RE | Admit: 2021-06-06 | Discharge: 2021-06-06 | Disposition: A | Discharge status: Home / Self Care | Payer: BLUE CROSS/BLUE SHIELD | Source: Ambulatory Visit | Attending: Urology | Admitting: Urology

## 2021-06-06 DIAGNOSIS — R932 Abnormal findings on diagnostic imaging of liver and biliary tract: Secondary | ICD-10-CM

## 2021-06-06 MED ORDER — GADOBENATE DIMEGLUMINE 529 MG/ML IV SOLN
16.0000 mL | Freq: Once | INTRAVENOUS | Status: AC | PRN
  Administered 2021-06-06: 16 mL via INTRAVENOUS

## 2021-06-15 NOTE — Patient Instructions (Signed)
DUE TO COVID-19 ONLY ONE VISITOR IS ALLOWED TO COME WITH YOU AND STAY IN THE WAITING ROOM ONLY DURING PRE OP AND PROCEDURE DAY OF SURGERY. THE 1 VISITOR  MAY VISIT WITH YOU AFTER SURGERY IN YOUR PRIVATE ROOM DURING VISITING HOURS ONLY!                MANA ALLBRIGHT     Your procedure is scheduled on: 06/19/21   Report to [facility] Main  Entrance   Report to admitting at   10:00 AM     Call this number if you have problems the morning of surgery [phone]    Remember: Do not eat food after Midnight.  You may have clear liquid until 7:00 am   BRUSH YOUR TEETH MORNING OF SURGERY AND RINSE YOUR MOUTH OUT, NO CHEWING GUM CANDY OR MINTS.     Take these medicines the morning of surgery with A SIP OF WATER: Metoprolol, Amlodipine                                 You may not have any metal on your body including hair pins and              piercings  Do not wear jewelry, make-up, lotions, powders or perfumes, deodorant             Do not wear nail polish on your fingernails.  Do not shave  48 hours prior to surgery.                Do not bring valuables to the hospital. [place].  Contacts, dentures or bridgework may not be worn into surgery.      Name and phone number of your driver:  Special Instructions: N/A              Please read over the following fact sheets you were given: _____________________________________________________________________             [facility] - Preparing for Surgery Before surgery, you can play an important role.  Because skin is not sterile, your skin needs to be as free of germs as possible.  You can reduce the number of germs on your skin by washing with CHG (chlorahexidine gluconate) soap before surgery.  CHG is an antiseptic cleaner which kills germs and bonds with the skin to continue killing germs even after washing. Please DO NOT use if you have an allergy to CHG or antibacterial  soaps.  If your skin becomes reddened/irritated stop using the CHG and inform your nurse when you arrive at Short Stay. Do not shave (including legs and underarms) for at least 48 hours prior to the first CHG shower.   Please follow these instructions carefully:  1.  Shower with CHG Soap the night before surgery and the  morning of Surgery.  2.  If you choose to wash your hair, wash your hair first as usual with your  normal  shampoo.  3.  After you shampoo, rinse your hair and body thoroughly to remove the  shampoo.                                        4.  Use CHG as you would any other liquid soap.  You can apply chg directly  to the skin and wash                       Gently with a scrungie or clean washcloth.  5.  Apply the CHG Soap to your body ONLY FROM THE NECK DOWN.   Do not use on face/ open                           Wound or open sores. Avoid contact with eyes, ears mouth and genitals (private parts).                       Wash face,  Genitals (private parts) with your normal soap.             6.  Wash thoroughly, paying special attention to the area where your surgery  will be performed.  7.  Thoroughly rinse your body with warm water from the neck down.  8.  DO NOT shower/wash with your normal soap after using and rinsing off  the CHG Soap.             9.  Pat yourself dry with a clean towel.            10.  Wear clean pajamas.            11.  Place clean sheets on your bed the night of your first shower and do not  sleep with pets. Day of Surgery : Do not apply any lotions/deodorants the morning of surgery.  Please wear clean clothes to the hospital/surgery center.  FAILURE TO FOLLOW THESE INSTRUCTIONS MAY RESULT IN THE CANCELLATION OF YOUR SURGERY PATIENT SIGNATURE_________________________________  NURSE SIGNATURE__________________________________  ________________________________________________________________________

## 2021-06-16 ENCOUNTER — Other Ambulatory Visit (HOSPITAL_COMMUNITY)
Admission: RE | Admit: 2021-06-16 | Discharge: 2021-06-16 | Disposition: A | Discharge status: Home / Self Care | Payer: BC Managed Care – PPO | Source: Ambulatory Visit | Attending: Urology | Admitting: Urology

## 2021-06-16 ENCOUNTER — Other Ambulatory Visit: Payer: Self-pay

## 2021-06-16 ENCOUNTER — Other Ambulatory Visit: Payer: Self-pay | Admitting: Radiology

## 2021-06-16 ENCOUNTER — Encounter (HOSPITAL_COMMUNITY): Payer: Self-pay

## 2021-06-16 ENCOUNTER — Encounter (HOSPITAL_COMMUNITY)
Admission: RE | Admit: 2021-06-16 | Discharge: 2021-06-16 | Disposition: A | Discharge status: Home / Self Care | Payer: BC Managed Care – PPO | Source: Ambulatory Visit | Attending: Urology | Admitting: Urology

## 2021-06-16 DIAGNOSIS — Z01812 Encounter for preprocedural laboratory examination: Secondary | ICD-10-CM | POA: Insufficient documentation

## 2021-06-16 DIAGNOSIS — Z20822 Contact with and (suspected) exposure to covid-19: Secondary | ICD-10-CM | POA: Diagnosis not present

## 2021-06-16 HISTORY — DX: Personal history of urinary calculi: Z87.442

## 2021-06-16 LAB — CBC WITH DIFFERENTIAL/PLATELET
Abs Immature Granulocytes: 0.01 10*3/uL (ref 0.00–0.07)
Basophils Absolute: 0.1 10*3/uL (ref 0.0–0.1)
Basophils Relative: 1 %
Eosinophils Absolute: 0.1 10*3/uL (ref 0.0–0.5)
Eosinophils Relative: 3 %
HCT: 40.7 % (ref 36.0–46.0)
Hemoglobin: 13.2 g/dL (ref 12.0–15.0)
Immature Granulocytes: 0 %
Lymphocytes Relative: 26 %
Lymphs Abs: 1.4 10*3/uL (ref 0.7–4.0)
MCH: 29.3 pg (ref 26.0–34.0)
MCHC: 32.4 g/dL (ref 30.0–36.0)
MCV: 90.4 fL (ref 80.0–100.0)
Monocytes Absolute: 0.5 10*3/uL (ref 0.1–1.0)
Monocytes Relative: 10 %
Neutro Abs: 3.1 10*3/uL (ref 1.7–7.7)
Neutrophils Relative %: 60 %
Platelets: 276 10*3/uL (ref 150–400)
RBC: 4.5 MIL/uL (ref 3.87–5.11)
RDW: 13.9 % (ref 11.5–15.5)
WBC: 5.2 10*3/uL (ref 4.0–10.5)
nRBC: 0 % (ref 0.0–0.2)

## 2021-06-16 LAB — BASIC METABOLIC PANEL
Anion gap: 8 (ref 5–15)
BUN: 17 mg/dL (ref 8–23)
CO2: 25 mmol/L (ref 22–32)
Calcium: 8.9 mg/dL (ref 8.9–10.3)
Chloride: 106 mmol/L (ref 98–111)
Creatinine, Ser: 0.68 mg/dL (ref 0.44–1.00)
GFR, Estimated: >60 mL/min (ref >60)
Glucose, Bld: 105 mg/dL — ABNORMAL HIGH (ref 70–99)
Potassium: 4.3 mmol/L (ref 3.5–5.1)
Sodium: 139 mmol/L (ref 135–145)

## 2021-06-16 LAB — PROTIME-INR
INR: 1 (ref 0.8–1.2)
Prothrombin Time: 13.4 seconds (ref 11.4–15.2)

## 2021-06-16 LAB — TYPE AND SCREEN
ABO/RH(D): A POS
Antibody Screen: NEGATIVE

## 2021-06-16 LAB — SARS CORONAVIRUS 2 (TAT 6-24 HRS): SARS Coronavirus 2: NEGATIVE

## 2021-06-19 ENCOUNTER — Observation Stay (HOSPITAL_COMMUNITY)
Admission: RE | Admit: 2021-06-19 | Discharge: 2021-06-20 | Disposition: A | Discharge status: Home / Self Care | Payer: BC Managed Care – PPO | Source: Ambulatory Visit | Attending: Urology | Admitting: Urology

## 2021-06-19 ENCOUNTER — Ambulatory Visit (HOSPITAL_COMMUNITY)
Admission: RE | Admit: 2021-06-19 | Discharge: 2021-06-19 | Disposition: A | Discharge status: Home / Self Care | Payer: BC Managed Care – PPO | Source: Ambulatory Visit | Attending: Urology | Admitting: Urology

## 2021-06-19 ENCOUNTER — Encounter (HOSPITAL_COMMUNITY)
Admission: RE | Disposition: A | Discharge status: Home / Self Care | Payer: Self-pay | Source: Ambulatory Visit | Attending: Urology

## 2021-06-19 ENCOUNTER — Encounter (HOSPITAL_COMMUNITY): Payer: Self-pay | Admitting: Urology

## 2021-06-19 ENCOUNTER — Other Ambulatory Visit: Payer: Self-pay

## 2021-06-19 ENCOUNTER — Ambulatory Visit (HOSPITAL_COMMUNITY): Payer: BC Managed Care – PPO | Admitting: Physician Assistant

## 2021-06-19 ENCOUNTER — Ambulatory Visit (HOSPITAL_COMMUNITY): Payer: BC Managed Care – PPO

## 2021-06-19 DIAGNOSIS — Z85828 Personal history of other malignant neoplasm of skin: Secondary | ICD-10-CM | POA: Insufficient documentation

## 2021-06-19 DIAGNOSIS — Z96652 Presence of left artificial knee joint: Secondary | ICD-10-CM | POA: Diagnosis not present

## 2021-06-19 DIAGNOSIS — Z87891 Personal history of nicotine dependence: Secondary | ICD-10-CM | POA: Insufficient documentation

## 2021-06-19 DIAGNOSIS — N200 Calculus of kidney: Secondary | ICD-10-CM | POA: Diagnosis not present

## 2021-06-19 DIAGNOSIS — I10 Essential (primary) hypertension: Secondary | ICD-10-CM | POA: Diagnosis not present

## 2021-06-19 DIAGNOSIS — Z79899 Other long term (current) drug therapy: Secondary | ICD-10-CM | POA: Insufficient documentation

## 2021-06-19 HISTORY — PX: IR URETERAL STENT RIGHT NEW ACCESS W/O SEP NEPHROSTOMY CATH: IMG6076

## 2021-06-19 HISTORY — PX: NEPHROLITHOTOMY: SHX5134

## 2021-06-19 LAB — BASIC METABOLIC PANEL
Anion gap: 6 (ref 5–15)
BUN: 16 mg/dL (ref 8–23)
CO2: 26 mmol/L (ref 22–32)
Calcium: 8.4 mg/dL — ABNORMAL LOW (ref 8.9–10.3)
Chloride: 108 mmol/L (ref 98–111)
Creatinine, Ser: 0.64 mg/dL (ref 0.44–1.00)
GFR, Estimated: >60 mL/min (ref >60)
Glucose, Bld: 107 mg/dL — ABNORMAL HIGH (ref 70–99)
Potassium: 3.8 mmol/L (ref 3.5–5.1)
Sodium: 140 mmol/L (ref 135–145)

## 2021-06-19 LAB — HEMOGLOBIN AND HEMATOCRIT, BLOOD
HCT: 37.5 % (ref 36.0–46.0)
Hemoglobin: 12 g/dL (ref 12.0–15.0)

## 2021-06-19 SURGERY — NEPHROLITHOTOMY PERCUTANEOUS
Anesthesia: General | Site: Back | Laterality: Right

## 2021-06-19 MED ORDER — TRAMADOL HCL 50 MG PO TABS: 50.0000 mg | ORAL_TABLET | Freq: Four times a day (QID) | ORAL | Status: DC | PRN

## 2021-06-19 MED ORDER — HYDROMORPHONE HCL 1 MG/ML IJ SOLN
INTRAMUSCULAR | Status: AC
  Filled 2021-06-19: qty 1

## 2021-06-19 MED ORDER — DEXTROSE-NACL 5-0.45 % IV SOLN: INTRAVENOUS | Status: DC

## 2021-06-19 MED ORDER — IOHEXOL 300 MG/ML  SOLN
INTRAMUSCULAR | Status: DC | PRN
  Administered 2021-06-19: 15 mL

## 2021-06-19 MED ORDER — PROMETHAZINE HCL 25 MG/ML IJ SOLN
INTRAMUSCULAR | Status: AC
  Filled 2021-06-19: qty 1

## 2021-06-19 MED ORDER — LACTATED RINGERS IV SOLN: INTRAVENOUS | Status: DC

## 2021-06-19 MED ORDER — DEXTROSE 5 % IV SOLN
INTRAVENOUS | Status: DC | PRN
  Administered 2021-06-19: 2 g via INTRAVENOUS

## 2021-06-19 MED ORDER — MIDAZOLAM HCL 2 MG/2ML IJ SOLN
INTRAMUSCULAR | Status: AC | PRN
  Administered 2021-06-19 (×4): 1 mg via INTRAVENOUS

## 2021-06-19 MED ORDER — DEXAMETHASONE SODIUM PHOSPHATE 10 MG/ML IJ SOLN
INTRAMUSCULAR | Status: AC
  Filled 2021-06-19: qty 1

## 2021-06-19 MED ORDER — FENTANYL CITRATE (PF) 250 MCG/5ML IJ SOLN
INTRAMUSCULAR | Status: AC
  Filled 2021-06-19: qty 5

## 2021-06-19 MED ORDER — PROMETHAZINE HCL 25 MG/ML IJ SOLN
6.2500 mg | INTRAMUSCULAR | Status: DC | PRN
  Administered 2021-06-19: 6.25 mg via INTRAVENOUS

## 2021-06-19 MED ORDER — LIDOCAINE HCL 1 % IJ SOLN
INTRAMUSCULAR | Status: AC
  Administered 2021-06-19: 10 mL via SUBCUTANEOUS
  Filled 2021-06-19: qty 20

## 2021-06-19 MED ORDER — ACETAMINOPHEN 500 MG PO TABS
1000.0000 mg | ORAL_TABLET | Freq: Four times a day (QID) | ORAL | Status: DC
  Administered 2021-06-19 – 2021-06-20 (×2): 1000 mg via ORAL
  Filled 2021-06-19 (×2): qty 2

## 2021-06-19 MED ORDER — LIDOCAINE 2% (20 MG/ML) 5 ML SYRINGE
INTRAMUSCULAR | Status: AC
  Filled 2021-06-19: qty 5

## 2021-06-19 MED ORDER — TRAMADOL HCL 50 MG PO TABS
50.0000 mg | ORAL_TABLET | Freq: Once | ORAL | Status: AC | PRN
  Administered 2021-06-19: 50 mg via ORAL

## 2021-06-19 MED ORDER — SODIUM CHLORIDE 0.9 % IV SOLN
2.0000 g | INTRAVENOUS | Status: AC
  Administered 2021-06-19: 2 g via INTRAVENOUS
  Filled 2021-06-19: qty 2

## 2021-06-19 MED ORDER — LIDOCAINE 2% (20 MG/ML) 5 ML SYRINGE
INTRAMUSCULAR | Status: DC | PRN
  Administered 2021-06-19: 100 mg via INTRAVENOUS

## 2021-06-19 MED ORDER — 0.9 % SODIUM CHLORIDE (POUR BTL) OPTIME
TOPICAL | Status: DC | PRN
  Administered 2021-06-19: 1000 mL

## 2021-06-19 MED ORDER — DEXAMETHASONE SODIUM PHOSPHATE 10 MG/ML IJ SOLN
INTRAMUSCULAR | Status: DC | PRN
  Administered 2021-06-19: 5 mg via INTRAVENOUS

## 2021-06-19 MED ORDER — EPINEPHRINE 0.3 MG/0.3ML IJ SOAJ: 0.3000 mg | INTRAMUSCULAR | Status: DC | PRN

## 2021-06-19 MED ORDER — LORATADINE 10 MG PO TABS
10.0000 mg | ORAL_TABLET | Freq: Every day | ORAL | Status: DC
  Administered 2021-06-20: 10 mg via ORAL
  Filled 2021-06-19: qty 1

## 2021-06-19 MED ORDER — FENTANYL CITRATE (PF) 250 MCG/5ML IJ SOLN
INTRAMUSCULAR | Status: DC | PRN
  Administered 2021-06-19: 50 ug via INTRAVENOUS

## 2021-06-19 MED ORDER — SODIUM CHLORIDE 0.9 % IV SOLN
INTRAVENOUS | Status: AC
  Filled 2021-06-19: qty 20

## 2021-06-19 MED ORDER — SUCCINYLCHOLINE CHLORIDE 200 MG/10ML IV SOSY
PREFILLED_SYRINGE | INTRAVENOUS | Status: DC | PRN
Start: 2021-06-19 — End: 2021-06-19
  Administered 2021-06-19: 120 mg via INTRAVENOUS

## 2021-06-19 MED ORDER — SODIUM CHLORIDE 0.9 % IV SOLN: 2.0000 g | Freq: Once | INTRAVENOUS | Status: DC

## 2021-06-19 MED ORDER — ONDANSETRON HCL 4 MG/2ML IJ SOLN: 4.0000 mg | INTRAMUSCULAR | Status: DC | PRN

## 2021-06-19 MED ORDER — SUGAMMADEX SODIUM 200 MG/2ML IV SOLN
INTRAVENOUS | Status: DC | PRN
  Administered 2021-06-19: 200 mg via INTRAVENOUS

## 2021-06-19 MED ORDER — MEPERIDINE HCL 50 MG/ML IJ SOLN: 6.2500 mg | INTRAMUSCULAR | Status: DC | PRN

## 2021-06-19 MED ORDER — ORAL CARE MOUTH RINSE: 15.0000 mL | Freq: Once | OROMUCOSAL | Status: AC

## 2021-06-19 MED ORDER — SODIUM CHLORIDE 0.9 % IV SOLN
1.0000 g | INTRAVENOUS | Status: DC
  Filled 2021-06-19: qty 10

## 2021-06-19 MED ORDER — BUPIVACAINE HCL 0.25 % IJ SOLN
INTRAMUSCULAR | Status: DC | PRN
  Administered 2021-06-19: 20 mL

## 2021-06-19 MED ORDER — MORPHINE SULFATE (PF) 2 MG/ML IV SOLN: 2.0000 mg | INTRAVENOUS | Status: DC | PRN

## 2021-06-19 MED ORDER — HYDROMORPHONE HCL 1 MG/ML IJ SOLN
0.2500 mg | INTRAMUSCULAR | Status: DC | PRN
  Administered 2021-06-19 (×3): 0.5 mg via INTRAVENOUS

## 2021-06-19 MED ORDER — MIDAZOLAM HCL 2 MG/2ML IJ SOLN
INTRAMUSCULAR | Status: AC
  Filled 2021-06-19: qty 6

## 2021-06-19 MED ORDER — SODIUM CHLORIDE 0.9 % IR SOLN
Status: DC | PRN
  Administered 2021-06-19: 6000 mL

## 2021-06-19 MED ORDER — SODIUM CHLORIDE 0.9 % IV SOLN: INTRAVENOUS | Status: DC

## 2021-06-19 MED ORDER — ROCURONIUM BROMIDE 10 MG/ML (PF) SYRINGE
PREFILLED_SYRINGE | INTRAVENOUS | Status: DC | PRN
  Administered 2021-06-19: 50 mg via INTRAVENOUS

## 2021-06-19 MED ORDER — CHLORHEXIDINE GLUCONATE 0.12 % MT SOLN
15.0000 mL | Freq: Once | OROMUCOSAL | Status: AC
  Administered 2021-06-19: 15 mL via OROMUCOSAL

## 2021-06-19 MED ORDER — ONDANSETRON HCL 4 MG/2ML IJ SOLN
INTRAMUSCULAR | Status: DC | PRN
  Administered 2021-06-19: 4 mg via INTRAVENOUS

## 2021-06-19 MED ORDER — AMLODIPINE BESYLATE 5 MG PO TABS
2.5000 mg | ORAL_TABLET | Freq: Every day | ORAL | Status: DC
  Administered 2021-06-20: 2.5 mg via ORAL
  Filled 2021-06-19: qty 1

## 2021-06-19 MED ORDER — DIPHENHYDRAMINE HCL 12.5 MG/5ML PO ELIX: 6.2500 mg | ORAL_SOLUTION | Freq: Four times a day (QID) | ORAL | Status: DC | PRN

## 2021-06-19 MED ORDER — BUPIVACAINE HCL 0.25 % IJ SOLN
INTRAMUSCULAR | Status: AC
  Filled 2021-06-19: qty 1

## 2021-06-19 MED ORDER — CEFAZOLIN SODIUM-DEXTROSE 2-4 GM/100ML-% IV SOLN
INTRAVENOUS | Status: AC
  Filled 2021-06-19: qty 100

## 2021-06-19 MED ORDER — SUCCINYLCHOLINE CHLORIDE 200 MG/10ML IV SOSY
PREFILLED_SYRINGE | INTRAVENOUS | Status: AC
  Filled 2021-06-19: qty 10

## 2021-06-19 MED ORDER — PANTOPRAZOLE SODIUM 40 MG PO TBEC
40.0000 mg | DELAYED_RELEASE_TABLET | Freq: Every day | ORAL | Status: DC
  Administered 2021-06-20: 40 mg via ORAL
  Filled 2021-06-19: qty 1

## 2021-06-19 MED ORDER — MIDAZOLAM HCL 5 MG/5ML IJ SOLN
INTRAMUSCULAR | Status: DC | PRN
  Administered 2021-06-19: 2 mg via INTRAVENOUS

## 2021-06-19 MED ORDER — TRAMADOL HCL 50 MG PO TABS
ORAL_TABLET | ORAL | Status: AC
  Filled 2021-06-19: qty 1

## 2021-06-19 MED ORDER — IOHEXOL 300 MG/ML  SOLN: 25.0000 mL | Freq: Once | INTRAMUSCULAR | Status: DC | PRN

## 2021-06-19 MED ORDER — MIDAZOLAM HCL 2 MG/2ML IJ SOLN
INTRAMUSCULAR | Status: AC
  Filled 2021-06-19: qty 2

## 2021-06-19 MED ORDER — FENTANYL CITRATE (PF) 100 MCG/2ML IJ SOLN
INTRAMUSCULAR | Status: AC
  Filled 2021-06-19: qty 4

## 2021-06-19 MED ORDER — METOPROLOL TARTRATE 25 MG/10 ML ORAL SUSPENSION
6.2500 mg | Freq: Two times a day (BID) | ORAL | Status: DC
  Administered 2021-06-19 – 2021-06-20 (×2): 6.25 mg via ORAL
  Filled 2021-06-19 (×2): qty 5

## 2021-06-19 MED ORDER — BUPROPION HCL ER (SR) 150 MG PO TB12
150.0000 mg | ORAL_TABLET | Freq: Every evening | ORAL | Status: DC
  Administered 2021-06-19: 150 mg via ORAL
  Filled 2021-06-19: qty 1

## 2021-06-19 MED ORDER — FENTANYL CITRATE (PF) 100 MCG/2ML IJ SOLN
INTRAMUSCULAR | Status: AC | PRN
  Administered 2021-06-19 (×2): 50 ug via INTRAVENOUS

## 2021-06-19 MED ORDER — ONDANSETRON HCL 4 MG/2ML IJ SOLN
INTRAMUSCULAR | Status: AC
  Filled 2021-06-19: qty 2

## 2021-06-19 MED ORDER — PROPOFOL 10 MG/ML IV BOLUS
INTRAVENOUS | Status: DC | PRN
Start: 2021-06-19 — End: 2021-06-19
  Administered 2021-06-19: 150 mg via INTRAVENOUS

## 2021-06-19 MED ORDER — LIDOCAINE HCL (PF) 1 % IJ SOLN
INTRAMUSCULAR | Status: AC | PRN
  Administered 2021-06-19: 10 mL via INTRADERMAL

## 2021-06-19 SURGICAL SUPPLY — 46 items
BAG COUNTER SPONGE SURGICOUNT (BAG) IMPLANT
BAG URINE DRAIN 2000ML AR STRL (UROLOGICAL SUPPLIES) IMPLANT
BASKET ZERO TIP NITINOL 2.4FR (BASKET) IMPLANT
BENZOIN TINCTURE PRP APPL 2/3 (GAUZE/BANDAGES/DRESSINGS) ×2 IMPLANT
BLADE SURG 15 STRL LF DISP TIS (BLADE) ×1 IMPLANT
BLADE SURG 15 STRL SS (BLADE) ×1
CATH FOLEY 2W COUNCIL 20FR 5CC (CATHETERS) IMPLANT
CATH ROBINSON RED A/P 20FR (CATHETERS) IMPLANT
CATH URET DUAL LUMEN 6-10FR 50 (CATHETERS) ×2 IMPLANT
CATH X-FORCE N30 NEPHROSTOMY (TUBING) ×2 IMPLANT
CHLORAPREP W/TINT 26 (MISCELLANEOUS) ×2 IMPLANT
COVER SURGICAL LIGHT HANDLE (MISCELLANEOUS) IMPLANT
DRAPE C-ARM 42X120 X-RAY (DRAPES) ×2 IMPLANT
DRAPE LINGEMAN PERC (DRAPES) ×2 IMPLANT
DRAPE SURG IRRIG POUCH 19X23 (DRAPES) ×2 IMPLANT
DRSG PAD ABDOMINAL 8X10 ST (GAUZE/BANDAGES/DRESSINGS) ×4 IMPLANT
DRSG TEGADERM 4X4.75 (GAUZE/BANDAGES/DRESSINGS) ×2 IMPLANT
DRSG TEGADERM 8X12 (GAUZE/BANDAGES/DRESSINGS) IMPLANT
GAUZE SPONGE 4X4 12PLY STRL (GAUZE/BANDAGES/DRESSINGS) ×2 IMPLANT
GLOVE SURG ENC MOIS LTX SZ7.5 (GLOVE) ×2 IMPLANT
GOWN STRL REUS W/TWL XL LVL3 (GOWN DISPOSABLE) ×2 IMPLANT
GUIDEWIRE AMPLAZ .035X145 (WIRE) ×4 IMPLANT
KIT BASIN OR (CUSTOM PROCEDURE TRAY) ×2 IMPLANT
KIT PROBE TRILOGY 3.9X350 (MISCELLANEOUS) IMPLANT
KIT TURNOVER KIT A (KITS) ×2 IMPLANT
LASER FIB FLEXIVA PULSE ID 365 (Laser) IMPLANT
MANIFOLD NEPTUNE II (INSTRUMENTS) ×2 IMPLANT
NS IRRIG 1000ML POUR BTL (IV SOLUTION) ×2 IMPLANT
PACK CYSTO (CUSTOM PROCEDURE TRAY) ×2 IMPLANT
SPONGE T-LAP 4X18 ~~LOC~~+RFID (SPONGE) ×2 IMPLANT
STENT ENDOURETEROTOMY 7-14 26C (STENTS) IMPLANT
STENT URET 6FRX26 CONTOUR (STENTS) ×2 IMPLANT
SUT CHROMIC 3 0 SH 27 (SUTURE) IMPLANT
SUT SILK 2 0 30  PSL (SUTURE)
SUT SILK 2 0 30 PSL (SUTURE) IMPLANT
SYR 10ML LL (SYRINGE) ×2 IMPLANT
SYR 20ML LL LF (SYRINGE) ×4 IMPLANT
TOWEL OR 17X26 10 PK STRL BLUE (TOWEL DISPOSABLE) ×2 IMPLANT
TOWEL OR NON WOVEN STRL DISP B (DISPOSABLE) ×2 IMPLANT
TRACTIP FLEXIVA PULS ID 200XHI (Laser) IMPLANT
TRACTIP FLEXIVA PULSE ID 200 (Laser)
TRAY FOLEY MTR SLVR 16FR STAT (SET/KITS/TRAYS/PACK) ×2 IMPLANT
TUBING CONNECTING 10 (TUBING) ×4 IMPLANT
TUBING STONE CATCHER TRILOGY (MISCELLANEOUS) IMPLANT
TUBING UROLOGY SET (TUBING) ×2 IMPLANT
WATER STERILE IRR 1000ML POUR (IV SOLUTION) ×2 IMPLANT

## 2021-06-19 NOTE — H&P (Signed)
H&P  Chief Complaint: Right renal calculus  History of Present Illness: 60 year old female has been having recurrent urinary tract infections. She states she has had 4-5 per year especially in the last year. She had a CT scan of the abdomen and pelvis in March of 2021. This revealed a 1.7 cm right renal calculus. She does get some intermittent right-sided flank pain but her main complaint are these recurrent infections. She states that these are culture proven but I do not have any cultures for review. She is currently on Keflex and urinalysis is negative. She has had a cardiac residual in the past but no stents. She is on aspirin 81 mg.   Past Medical History:  Diagnosis Date   Arthritis    Complication of anesthesia    Dysrhythmia    GERD (gastroesophageal reflux disease)    History of kidney stones    Hypertension    Mitral valve prolapse    Pneumonia    as child   PONV (postoperative nausea and vomiting)    Skin cancer 2014   2 spots labeled "pre-melanoma"   Past Surgical History:  Procedure Laterality Date   ABDOMINAL HYSTERECTOMY  2014   still have ovaries   BREAST SURGERY Left 2012   Bx   BUNIONECTOMY  2011   bilateral   DILATION AND CURETTAGE OF UTERUS     INCONTINENCE SURGERY  2016   sling   micro abrasive surgery     left knee   TOTAL KNEE ARTHROPLASTY Left 07/16/2016   Procedure: LEFT TOTAL KNEE ARTHROPLASTY;  Surgeon: Paralee Cancel, MD;  Location: [facility];  Service: Orthopedics;  Laterality: Left;    Home Medications:  Medications Prior to Admission  Medication Sig Dispense Refill Last Dose   amLODipine (NORVASC) 2.5 MG tablet Take 2.5 mg by mouth daily.   06/19/2021 at 0700   buPROPion (WELLBUTRIN SR) 150 MG 12 hr tablet Take 150 mg by mouth every evening.   06/18/2021   cephALEXin (KEFLEX) 250 MG capsule Take 250 mg by mouth at bedtime.   06/18/2021   loratadine (CLARITIN) 10 MG tablet Take 10 mg by mouth daily.   06/18/2021   metoprolol tartrate (LOPRESSOR) 25 MG  tablet Take 6.25 mg by mouth in the morning and at bedtime.   06/19/2021 at 0700   omeprazole (PRILOSEC) 40 MG capsule Take 40 mg by mouth daily.   06/18/2021   aspirin EC 81 MG tablet Take 81 mg by mouth daily. Swallow whole. (Patient not taking: Reported on 06/15/2021)   Not Taking   celecoxib (CELEBREX) 200 MG capsule Take 1 capsule (200 mg total) by mouth every 12 (twelve) hours. (Patient not taking: No sig reported) 60 capsule 0 Not Taking   Cholecalciferol (VITAMIN D) 50 MCG (2000 UT) CAPS Take 4,000 Units by mouth daily. (Patient not taking: Reported on 06/15/2021)   Not Taking   diphenhydrAMINE HCl 6.25 MG/ML LIQD Take 6.25 mg by mouth 4 (four) times daily as needed for allergies (alpha gal).   More than a month   docusate sodium (COLACE) 100 MG capsule Take 1 capsule (100 mg total) by mouth 2 (two) times daily. (Patient not taking: No sig reported) 10 capsule 0 Not Taking   EPINEPHrine 0.3 mg/0.3 mL IJ SOAJ injection Inject 0.3 mg into the muscle as needed for anaphylaxis.   More than a month   ferrous sulfate 325 (65 FE) MG tablet Take 1 tablet (325 mg total) by mouth 3 (three) times daily after meals. (  Patient not taking: No sig reported)  3 Not Taking   HYDROcodone-acetaminophen (NORCO) 7.5-325 MG tablet Take 1-2 tablets by mouth every 4 (four) hours as needed for moderate pain. (Patient not taking: No sig reported) 100 tablet 0 Not Taking   methocarbamol (ROBAXIN) 500 MG tablet Take 1 tablet (500 mg total) by mouth every 6 (six) hours as needed for muscle spasms. (Patient not taking: No sig reported) 40 tablet 0 Not Taking   ondansetron (ZOFRAN) 4 MG tablet Take 1 tablet (4 mg total) by mouth every 8 (eight) hours as needed for nausea or vomiting. (Patient not taking: No sig reported) 30 tablet 0 Not Taking   polyethylene glycol (MIRALAX / GLYCOLAX) packet Take 17 g by mouth 2 (two) times daily. (Patient not taking: No sig reported) 14 each 0 Not Taking   Allergies:  Allergies  Allergen  Reactions   Alpha-Gal Anaphylaxis    Family History  Problem Relation Age of Onset   Colon polyps Mother        "cancer inside the polyp"   Breast cancer Mother 46       ER- cancer   Colon cancer Maternal Grandmother        dx in her 76s   Spina bifida Brother    Brain cancer Other        maternal grandmother's sister; dx in her 67s   Breast cancer Cousin        mother's maternal cousin; dx in her late 66s   Ovarian cancer Cousin        mother's paternal cousin dx in her 30s   Breast cancer Cousin        mother's paternal first cousin   Kidney cancer Cousin        mother's paternal cousin   Social History:  reports that she quit smoking about 28 years ago. Her smoking use included cigarettes. She has a 6.50 pack-year smoking history. She has never used smokeless tobacco. She reports that she does not drink alcohol and does not use drugs.  ROS: A complete review of systems was performed.  All systems are negative except for pertinent findings as noted. ROS  Pulse 64   Temp 97.9 F (36.6 C) (Oral)   Resp 18   Ht '5\' 7"'$  (1.702 m)   Wt 79.8 kg   SpO2 100%   BMI 27.57 kg/m    Physical Exam:  Vital signs in last 24 hours: Temp:  [97.9 F (36.6 C)] 97.9 F (36.6 C) (08/01 1010) Pulse Rate:  [64-70] 69 (08/01 1150) Resp:  [14-18] 14 (08/01 1150) BP: (145-148)/(82-87) 145/82 (08/01 1150) SpO2:  [100 %] 100 % (08/01 1150) Weight:  [78.9 kg-79.8 kg] 79.8 kg (08/01 1028) General:  Alert and oriented, No acute distress HEENT: Normocephalic, atraumatic Neck: No JVD or lymphadenopathy Cardiovascular: Regular rate and rhythm Lungs: Regular rate and effort Abdomen: Soft, nontender, nondistended, no abdominal masses Back: No CVA tenderness Extremities: No edema Neurologic: Grossly intact  Laboratory Data:  No results found for this or any previous visit (from the past 24 hour(s)). Recent Results (from the past 240 hour(s))  SARS CORONAVIRUS 2 (TAT 6-24 HRS) Nasopharyngeal  Nasopharyngeal Swab     Status: None   Collection Time: 06/16/21  2:24 PM   Specimen: Nasopharyngeal Swab  Result Value Ref Range Status   SARS Coronavirus 2 NEGATIVE NEGATIVE Final    Comment: (NOTE) SARS-CoV-2 target nucleic acids are NOT DETECTED.  The SARS-CoV-2 RNA is generally detectable in  upper and lower respiratory specimens during the acute phase of infection. Negative results do not preclude SARS-CoV-2 infection, do not rule out co-infections with other pathogens, and should not be used as the sole basis for treatment or other patient management decisions. Negative results must be combined with clinical observations, patient history, and epidemiological information. The expected result is Negative.  Fact Sheet for Patients: SugarRoll.be  Fact Sheet for Healthcare Providers: https://www.woods-mathews.com/  This test is not yet approved or cleared by the [place] FDA and  has been authorized for detection and/or diagnosis of SARS-CoV-2 by FDA under an Emergency Use Authorization (EUA). This EUA will remain  in effect (meaning this test can be used) for the duration of the COVID-19 declaration under Se ction 564(b)(1) of the Act, 21 U.S.C. section 360bbb-3(b)(1), unless the authorization is terminated or revoked sooner.  Performed at [facility], [place] [address], [place], [place] [address]    Creatinine: Recent Labs    06/16/21 1353  CREATININE 0.68    Impression/Assessment:  Right renal calculus  Plan:  Proceed with right PCNL.  Risks and benefits discussed as previously documented.  Marton Redwood, III 06/19/2021, 11:57 AM

## 2021-06-19 NOTE — Anesthesia Procedure Notes (Addendum)
Procedure Name: Intubation Date/Time: 06/19/2021 12:58 PM Performed by: Talbot Grumbling, CRNA Pre-anesthesia Checklist: Patient identified, Patient being monitored, Timeout performed, Emergency Drugs available and Suction available Patient Re-evaluated:Patient Re-evaluated prior to induction Oxygen Delivery Method: Circle System Utilized and Circle system utilized Preoxygenation: Pre-oxygenation with 100% oxygen Induction Type: IV induction, Rapid sequence and Cricoid Pressure applied Ventilation: Mask ventilation without difficulty Laryngoscope Size: Mac and 3 Grade View: Grade I Tube type: Oral Tube size: 7.0 mm Number of attempts: 1 Airway Equipment and Method: stylet and Stylet Placement Confirmation: ETT inserted through vocal cords under direct vision, positive ETCO2, breath sounds checked- equal and bilateral and CO2 detector Secured at: 21 cm Tube secured with: Tape Dental Injury: Teeth and Oropharynx as per pre-operative assessment  Comments: Performed by Loura Pardon

## 2021-06-19 NOTE — Progress Notes (Signed)
Pt ate 5% dinner and Nausea Vomiting vomitted 200cc food containing emesis.  Phenergan 6.'25mg'$  IV given along Dilaudid 0.% mg x2 for pain.

## 2021-06-19 NOTE — Transfer of Care (Signed)
Immediate Anesthesia Transfer of Care Note  Patient: Alexandria [name]  Procedure(s) Performed: RIGHT NEPHROLITHOTOMY PERCUTANEOUS (Right: Back)  Patient Location: PACU  Anesthesia Type:General  Level of Consciousness: sedated  Airway & Oxygen Therapy: Patient Spontanous Breathing and Patient connected to face mask oxygen  Post-op Assessment: Report given to RN and Post -op Vital signs reviewed and stable  Post vital signs: Reviewed and stable  Last Vitals:  Vitals Value Taken Time  BP    Temp    Pulse    Resp    SpO2      Last Pain:  Vitals:   06/19/21 1010  TempSrc: Oral         Complications: No notable events documented.

## 2021-06-19 NOTE — Anesthesia Postprocedure Evaluation (Signed)
Anesthesia Post Note  Patient: Alexandria [name]  Procedure(s) Performed: RIGHT NEPHROLITHOTOMY PERCUTANEOUS (Right: Back)     Patient location during evaluation: PACU Anesthesia Type: General Level of consciousness: sedated and patient cooperative Pain management: pain level controlled Vital Signs Assessment: post-procedure vital signs reviewed and stable Respiratory status: spontaneous breathing Cardiovascular status: stable Anesthetic complications: no   No notable events documented.  Last Vitals:  Vitals:   06/19/21 1937 06/19/21 1944  BP:    Pulse:    Resp: 19 16  Temp:    SpO2:      Last Pain:  Vitals:   06/19/21 1944  TempSrc:   PainSc: [place]

## 2021-06-19 NOTE — Consult Note (Signed)
Chief Complaint: Patient was seen in consultation today for right percutaneous nephrostomy/nephroureteral catheter placement  Referring Physician(s): Bell,E  Supervising Physician: Ruthann Cancer  Patient Status: [facility] TBA  History of Present Illness: Alexandria [name] is a 60 y.o. female with past medical history of arthritis, GERD, hypertension, mitral valve prolapse, and skin cancer.  She has also had a history of nephrolithiasis with low back and groin pain. CT abdomen pelvis done on 05/12/2021 revealed nonobstructive calculi in the right renal collecting system measuring up to 1.8 cm in the lower pole.  There were no ureteral stones or findings of urinary tract obstruction.  She presents today for right percutaneous nephrostomy/nephroureteral catheter placement prior to nephrolithotomy.  Past Medical History:  Diagnosis Date   Arthritis    Complication of anesthesia    Dysrhythmia    GERD (gastroesophageal reflux disease)    History of kidney stones    Hypertension    Mitral valve prolapse    Pneumonia    as child   PONV (postoperative nausea and vomiting)    Skin cancer 2014   2 spots labeled "pre-melanoma"    Past Surgical History:  Procedure Laterality Date   ABDOMINAL HYSTERECTOMY  2014   still have ovaries   BREAST SURGERY Left 2012   Bx   BUNIONECTOMY  2011   bilateral   DILATION AND CURETTAGE OF UTERUS     INCONTINENCE SURGERY  2016   sling   micro abrasive surgery     left knee   TOTAL KNEE ARTHROPLASTY Left 07/16/2016   Procedure: LEFT TOTAL KNEE ARTHROPLASTY;  Surgeon: Paralee Cancel, MD;  Location: [facility];  Service: Orthopedics;  Laterality: Left;    Allergies: Alpha-gal  Medications: Prior to Admission medications   Medication Sig Start Date End Date Taking? Authorizing Provider  amLODipine (NORVASC) 2.5 MG tablet Take 2.5 mg by mouth daily. 01/24/21  Yes [provider]  buPROPion (WELLBUTRIN SR) 150 MG 12 hr tablet Take 150 mg by mouth  every evening. 01/24/21  Yes [provider]  cephALEXin (KEFLEX) 250 MG capsule Take 250 mg by mouth at bedtime. 06/13/21  Yes [provider]  loratadine (CLARITIN) 10 MG tablet Take 10 mg by mouth daily.   Yes [provider]  metoprolol tartrate (LOPRESSOR) 25 MG tablet Take 6.25 mg by mouth in the morning and at bedtime. 01/16/21  Yes [provider]  omeprazole (PRILOSEC) 40 MG capsule Take 40 mg by mouth daily. 05/23/21  Yes [provider]  aspirin EC 81 MG tablet Take 81 mg by mouth daily. Swallow whole. Patient not taking: Reported on 06/15/2021    [provider]  celecoxib (CELEBREX) 200 MG capsule Take 1 capsule (200 mg total) by mouth every 12 (twelve) hours. Patient not taking: No sig reported 07/16/16   Danae Orleans, PA-C  Cholecalciferol (VITAMIN D) 50 MCG (2000 UT) CAPS Take 4,000 Units by mouth daily. Patient not taking: Reported on 06/15/2021    [provider]  diphenhydrAMINE HCl 6.25 MG/ML LIQD Take 6.25 mg by mouth 4 (four) times daily as needed for allergies (alpha gal).    [provider]  docusate sodium (COLACE) 100 MG capsule Take 1 capsule (100 mg total) by mouth 2 (two) times daily. Patient not taking: No sig reported 07/16/16   Danae Orleans, PA-C  EPINEPHrine 0.3 mg/0.3 mL IJ SOAJ injection Inject 0.3 mg into the muscle as needed for anaphylaxis. 07/05/18   [provider]  ferrous sulfate 325 (  65 FE) MG tablet Take 1 tablet (325 mg total) by mouth 3 (three) times daily after meals. Patient not taking: No sig reported 07/16/16   Danae Orleans, PA-C  HYDROcodone-acetaminophen (NORCO) 7.5-325 MG tablet Take 1-2 tablets by mouth every 4 (four) hours as needed for moderate pain. Patient not taking: No sig reported 07/16/16   Danae Orleans, PA-C  methocarbamol (ROBAXIN) 500 MG tablet Take 1 tablet (500 mg total) by mouth every 6 (six) hours as needed for muscle spasms. Patient not taking: No  sig reported 07/16/16   Danae Orleans, PA-C  ondansetron (ZOFRAN) 4 MG tablet Take 1 tablet (4 mg total) by mouth every 8 (eight) hours as needed for nausea or vomiting. Patient not taking: No sig reported 07/17/16   Danae Orleans, PA-C  polyethylene glycol (MIRALAX / GLYCOLAX) packet Take 17 g by mouth 2 (two) times daily. Patient not taking: No sig reported 07/16/16   Danae Orleans, PA-C     Family History  Problem Relation Age of Onset   Colon polyps Mother        "cancer inside the polyp"   Breast cancer Mother 5       ER- cancer   Colon cancer Maternal Grandmother        dx in her 38s   Spina bifida Brother    Brain cancer Other        maternal grandmother's sister; dx in her 74s   Breast cancer Cousin        mother's maternal cousin; dx in her late 88s   Ovarian cancer Cousin        mother's paternal cousin dx in her 69s   Breast cancer Cousin        mother's paternal first cousin   Kidney cancer Cousin        mother's paternal cousin    Social History   Socioeconomic History   Marital status: Legally Separated    Spouse name: Not on file   Number of children: 1   Years of education: Not on file   Highest education level: Not on file  Occupational History    Employer: [employer]  Tobacco Use   Smoking status: Former    Packs/day: 0.50    Years: 13.00    Pack years: 6.50    Types: Cigarettes    Quit date: 10/14/1992    Years since quitting: 28.6   Smokeless tobacco: Never  Vaping Use   Vaping Use: Never used  Substance and Sexual Activity   Alcohol use: No   Drug use: No   Sexual activity: Not on file  Other Topics Concern   Not on file  Social History Narrative   Not on file   Social Determinants of Health   Financial Resource Strain: Not on file  Food Insecurity: Not on file  Transportation Needs: Not on file  Physical Activity: Not on file  Stress: Not on file  Social Connections: Not on file      Review of Systems currently  denies fever, headache, chest pain, dyspnea, cough, abdominal pain, nausea, vomiting or bleeding.  She does have intermittent low back pain  Vital Signs: Pulse 64   Temp 97.9 F (36.6 C) (Oral)   Resp 18   Ht '5\' 7"'$  (1.702 m)   Wt 176 lb (79.8 kg)   SpO2 100%   BMI 27.57 kg/m   Physical Exam awake, alert.  Chest clear to auscultation bilaterally.  Heart with regular rate and rhythm.  Abdomen  soft, positive bowel sounds, nontender.  No lower extremity edema.  Imaging: MR ABDOMEN WWO CONTRAST  Result Date: 06/07/2021 CLINICAL DATA:  Indeterminate liver lesion on noncontrast CT. No acute injury or prior relevant surgery. EXAM: MRI ABDOMEN WITHOUT AND WITH CONTRAST TECHNIQUE: Multiplanar multisequence MR imaging of the abdomen was performed both before and after the administration of intravenous contrast. CONTRAST:  62m MULTIHANCE GADOBENATE DIMEGLUMINE 529 MG/ML IV SOLN COMPARISON:  Noncontrast CT 05/12/2021. Abdominal ultrasound 01/26/2019. FINDINGS: Lower chest:  The visualized lower chest appears unremarkable. Hepatobiliary: The liver is normal in signal. Corresponding with the lesion in segment 2 of the liver on recent noncontrast CT is a T2 hyperintense lesion measuring 2.4 x 1.7 cm on image 12/11. This demonstrates peripheral discontinuous enhancement following contrast, although is incompletely opacified on the delayed post-contrast images. Appearance is most consistent with a slightly atypical hemangioma. There is an 8 mm T2 hyperintense lesion within the right lobe on image 11/11 which is also enhancing and likely corresponds with an echogenic lesion in the right lobe on prior ultrasound, also likely a hemangioma. No suspicious liver lesions. No evidence of gallstones, gallbladder wall thickening or biliary dilatation. Pancreas: Unremarkable. No pancreatic ductal dilatation or surrounding inflammatory changes. Spleen: Normal in size without focal abnormality. Adrenals/Urinary Tract: Both  adrenal glands appear normal. Small renal cysts bilaterally. The right renal calculi demonstrated by recent CT are not well visualized. No evidence of hydronephrosis or perinephric soft tissue stranding. Stomach/Bowel: The stomach appears unremarkable for its degree of distension. No evidence of bowel wall thickening, distention or surrounding inflammatory change. Vascular/Lymphatic: There are no enlarged abdominal lymph nodes. No significant vascular findings. Other: Small umbilical hernia containing only fat. Musculoskeletal: No acute or significant osseous findings. IMPRESSION: 1. The lesion in the left hepatic lobe demonstrates signal and enhancement characteristics most consistent with a slightly atypical hemangioma. This is supported by a 2nd lesion with a similar appearance and a corresponding echogenic lesion in the right lobe on previous ultrasound. No suspicious hepatic findings. 2. No acute abdominal findings. Electronically Signed   By: WRichardean SaleM.D.   On: 06/07/2021 16:56    Labs:  CBC: Recent Labs    06/16/21 1353  WBC 5.2  HGB 13.2  HCT 40.7  PLT 276    COAGS: Recent Labs    06/16/21 1353  INR 1.0    BMP: Recent Labs    06/16/21 1353  NA 139  K 4.3  CL 106  CO2 25  GLUCOSE 105*  BUN 17  CALCIUM 8.9  CREATININE 0.68  GFRNONAA >60    LIVER FUNCTION TESTS: No results for input(s): BILITOT, AST, ALT, ALKPHOS, PROT, ALBUMIN in the last 8760 hours.  TUMOR MARKERS: No results for input(s): AFPTM, CEA, CA199, CHROMGRNA in the last 8760 hours.  Assessment and Plan: 60y.o. female with past medical history of arthritis, GERD, hypertension, mitral valve prolapse, and skin cancer.  She has also had a history of nephrolithiasis with low back and groin pain. CT abdomen pelvis done on 05/12/2021 revealed nonobstructive calculi in the right renal collecting system measuring up to 1.8 cm in the lower pole.  There were no ureteral stones or findings of urinary tract  obstruction.  She presents today for right percutaneous nephrostomy/nephroureteral catheter placement prior to nephrolithotomy.Risks and benefits of right PCN placement was discussed with the patient including, but not limited to, infection, bleeding, significant bleeding causing loss or decrease in renal function or damage to adjacent structures.   All of  the patient's questions were answered, patient is agreeable to proceed.  Consent signed and in chart.      Thank you for this interesting consult.  I greatly enjoyed meeting Alexandria [name] and look forward to participating in their care.  A copy of this report was sent to the requesting provider on this date.  Electronically Signed: D. Rowe Robert, PA-C 06/19/2021, 10:29 AM   I spent a total of  25 minutes   in face to face in clinical consultation, greater than 50% of which was counseling/coordinating care for right percutaneous nephrostomy/nephroureteral catheter placement

## 2021-06-19 NOTE — Anesthesia Preprocedure Evaluation (Addendum)
Anesthesia Evaluation  Patient identified by MRN, date of birth, ID band Patient awake    Reviewed: Allergy & Precautions, H&P , NPO status , Patient's Chart, lab work & pertinent test results  History of Anesthesia Complications (+) PONV and history of anesthetic complications  Airway Mallampati: II  TM Distance: >3 FB Neck ROM: Full    Dental  (+) Dental Advisory Given   Pulmonary pneumonia, former smoker,    Pulmonary exam normal breath sounds clear to auscultation       Cardiovascular Exercise Tolerance: Good hypertension, Pt. on medications and Pt. on home beta blockers Normal cardiovascular exam+ dysrhythmias  Rhythm:Regular Rate:Normal     Neuro/Psych    GI/Hepatic Neg liver ROS, GERD  ,  Endo/Other  negative endocrine ROS  Renal/GU Renal disease     Musculoskeletal  (+) Arthritis ,   Abdominal   Peds  Hematology negative hematology ROS (+)   Anesthesia Other Findings   Reproductive/Obstetrics                            Anesthesia Physical  Anesthesia Plan  ASA: 2  Anesthesia Plan: General   Post-op Pain Management:    Induction: Intravenous  PONV Risk Score and Plan: 4 or greater and Treatment may vary due to age or medical condition, Ondansetron, Dexamethasone, Midazolam and Diphenhydramine  Airway Management Planned: Oral ETT  Additional Equipment: None  Intra-op Plan:   Post-operative Plan: Extubation in OR  Informed Consent: I have reviewed the patients History and Physical, chart, labs and discussed the procedure including the risks, benefits and alternatives for the proposed anesthesia with the patient or authorized representative who has indicated his/her understanding and acceptance.     Dental advisory given  Plan Discussed with: CRNA  Anesthesia Plan Comments: ( )       Anesthesia Quick Evaluation

## 2021-06-19 NOTE — Op Note (Signed)
Operative Note  Preoperative diagnosis:  1.  Right renal calculus  Postoperative diagnosis: 1.  Right renal calculus   Procedure(s): 1.  Right percutaneous nephrolithotomy with antegrade ureteral stent placement  Surgeon: Link Snuffer, MD  Assistants:Bishop Limbo, MD, resident  Anesthesia: General  Complications: None immediate  EBL: 50 cc  Specimens: 1.  Renal calculus  Drains/Catheters: 1.  6 x 26 double-J ureteral stent  Intraoperative findings: 1.  Approximately 1.5 cm lower pole calculus completely extracted.  Nephrostogram of the beginning showed good opacity of the kidney and access in the lower pole.  Indication: 60 year old female with a large right renal calculus presents for the previously mentioned operation.  Description of procedure:  The patient was identified and consent was obtained.  The patient was taken to the operating room and placed in the supine position.  The patient was placed under general anesthesia.  Perioperative antibiotics were administered.  The patient was placed in prone position and all pressure points were padded.  Patient was prepped and draped in a standard sterile fashion and a timeout was performed.  A Super Stiff wire was advanced through the nephroureteral stent down to the bladder under fluoroscopic guidance and the nephroureteral stent was removed.  A dual-lumen ureteral catheter was advanced over the Super Stiff wire into the renal pelvis and an antegrade nephrostogram was performed.  This showed a well opacified kidney and a filling defect corresponding to the stone of interest.  I advanced the dual-lumen ureteral catheter into the proximal ureter under fluoroscopic guidance followed by placement of a second Super Stiff wire down to the bladder under fluoroscopic guidance.  The dual-lumen catheter was removed.  An incision was made alongside the wires.  The balloon dilator was then advanced over one of the wires and into the renal  pelvis fluoroscopic guidance and the tract was dilated to a pressure of 18.  The sheath was advanced over the balloon and into the renal pelvis.  The balloon was withdrawn keeping the sheath in place.  The nephroscope was advanced into the kidney and the stone of interest was encountered.  The stone was then removed with graspers.All stone was removed and there was no evidence of any other stones within the kidney.  The nephroscope along with the sheath were withdrawn.    A 6 x 26 double-J ureteral stent was advanced over 1 of the wires under fluoroscopic guidance and the wire was withdrawn.  Fluoroscopy confirmed a good coil within the bladder as well as a good coil in the renal pelvis proximally.  20 cc of quarter percent Marcaine was used for anesthetic effect of the tract.  Incision was closed with a 4-0 Monocryl.  This concluded the operation.  The patient tolerated procedure well and was stable postoperatively.  Plan: Patient will remain under observation overnight. Stent to be removed in 1-2 weeks.

## 2021-06-19 NOTE — Procedures (Signed)
Interventional Radiology Procedure Note  Procedure: Right nephrostomy catheter placement  Findings: Please refer to procedural dictation for full description.  Right lower pole access, 5 Fr angle tipped catheter with tip in bladder.  Complications: None immediate  Estimated Blood Loss: < 5 mL  Recommendations: To OR with Urology.   Ruthann Cancer, MD

## 2021-06-20 ENCOUNTER — Encounter (HOSPITAL_COMMUNITY): Payer: Self-pay | Admitting: Urology

## 2021-06-20 DIAGNOSIS — N200 Calculus of kidney: Secondary | ICD-10-CM | POA: Diagnosis not present

## 2021-06-20 LAB — BASIC METABOLIC PANEL
Anion gap: 8 (ref 5–15)
BUN: 11 mg/dL (ref 8–23)
CO2: 26 mmol/L (ref 22–32)
Calcium: 8.7 mg/dL — ABNORMAL LOW (ref 8.9–10.3)
Chloride: 104 mmol/L (ref 98–111)
Creatinine, Ser: 0.59 mg/dL (ref 0.44–1.00)
GFR, Estimated: >60 mL/min (ref >60)
Glucose, Bld: 144 mg/dL — ABNORMAL HIGH (ref 70–99)
Potassium: 4.3 mmol/L (ref 3.5–5.1)
Sodium: 138 mmol/L (ref 135–145)

## 2021-06-20 LAB — HEMOGLOBIN AND HEMATOCRIT, BLOOD
HCT: 38 % (ref 36.0–46.0)
Hemoglobin: 12.1 g/dL (ref 12.0–15.0)

## 2021-06-20 MED ORDER — TRAMADOL HCL 50 MG PO TABS: 50.0000 mg | ORAL_TABLET | Freq: Four times a day (QID) | ORAL | 0 refills | Status: AC | PRN

## 2021-06-20 MED ORDER — CHLORHEXIDINE GLUCONATE CLOTH 2 % EX PADS: 6.0000 | MEDICATED_PAD | Freq: Every day | CUTANEOUS | Status: DC

## 2021-06-20 NOTE — Discharge Summary (Addendum)
[facility] Discharge Summary  Admit date: 06/19/2021  Discharge date and time: 06/20/21   Discharge to: Home  Discharge Service: Urology  Discharge Attending Physician:  Link Snuffer  Discharge  Diagnoses: Nephrolithiasis  Secondary Diagnosis: Active Problems:   Nephrolithiasis   OR Procedures: Procedure(s): RIGHT NEPHROLITHOTOMY PERCUTANEOUS 06/19/2021   Ancillary Procedures: None   Discharge Day Services: The patient was seen and examined by the Urology team both in the morning and immediately prior to discharge.  Vital signs and laboratory values were stable and within normal limits.  The physical exam was benign and unchanged and all surgical wounds were examined.  Discharge instructions were explained and all questions answered.  Subjective  No acute events overnight. Pain Controlled. No fever or chills.  Objective Patient Vitals for the past 8 hrs:  BP Temp Temp src Pulse SpO2  06/20/21 0538 131/88 98.3 F (36.8 C) Oral (!) 58 98 %  06/20/21 0212 128/76 98 F (36.7 C) Oral 66 97 %   No intake/output data recorded.  General Appearance:        No acute distress Lungs:                       Normal work of breathing on room air Heart:                                Regular rate and rhythm Abdomen:                         Soft, non-tender, non-distended. Incision C/D/I Extremities:                      Warm and well perfused   Hospital Course:  The patient underwent Right PCNL and antegrade JJ ureteral stent placement on 06/19/2021.  The patient tolerated the procedure well, was extubated in the OR, and afterwards was taken to the PACU for routine post-surgical care. When stable the patient was transferred to the floor.   The patient did well postoperatively.  The patient's diet was slowly advanced and at the time of discharge was tolerating a regular diet.  The patient was discharged home 1 Day Post-Op, at which point was tolerating a regular solid diet, was able to  void spontaneously, have adequate pain control with P.O. pain medication, and could ambulate without difficulty. The patient will follow up with [place] for post op check.   Condition at Discharge: Improved  Discharge Medications:  Allergies as of 06/20/2021       Reactions   Alpha-gal Anaphylaxis        Medication List     TAKE these medications    amLODipine 2.5 MG tablet Commonly known as: NORVASC Take 2.5 mg by mouth daily.   buPROPion 150 MG 12 hr tablet Commonly known as: WELLBUTRIN SR Take 150 mg by mouth every evening.   cephALEXin 250 MG capsule Commonly known as: KEFLEX Take 250 mg by mouth at bedtime.   diphenhydrAMINE HCl 6.25 MG/ML Liqd Take 6.25 mg by mouth 4 (four) times daily as needed for allergies (alpha gal).   EPINEPHrine 0.3 mg/0.3 mL Soaj injection Commonly known as: EPI-PEN Inject 0.3 mg into the muscle as needed for anaphylaxis.   loratadine 10 MG tablet Commonly known as: CLARITIN Take 10 mg by mouth daily.   metoprolol tartrate 25 MG tablet Commonly known as: LOPRESSOR  Take 6.25 mg by mouth in the morning and at bedtime.   omeprazole 40 MG capsule Commonly known as: PRILOSEC Take 40 mg by mouth daily.   traMADol 50 MG tablet Commonly known as: Ultram Take 1 tablet (50 mg total) by mouth every 6 (six) hours as needed.       ASK your doctor about these medications    aspirin EC 81 MG tablet Take 81 mg by mouth daily. Swallow whole.   celecoxib 200 MG capsule Commonly known as: CELEBREX Take 1 capsule (200 mg total) by mouth every 12 (twelve) hours.   docusate sodium 100 MG capsule Commonly known as: COLACE Take 1 capsule (100 mg total) by mouth 2 (two) times daily.   ferrous sulfate 325 (65 FE) MG tablet Take 1 tablet (325 mg total) by mouth 3 (three) times daily after meals.   HYDROcodone-acetaminophen 7.5-325 MG tablet Commonly known as: NORCO Take 1-2 tablets by mouth every 4 (four) hours as needed for moderate pain.    methocarbamol 500 MG tablet Commonly known as: ROBAXIN Take 1 tablet (500 mg total) by mouth every 6 (six) hours as needed for muscle spasms.   ondansetron 4 MG tablet Commonly known as: Zofran Take 1 tablet (4 mg total) by mouth every 8 (eight) hours as needed for nausea or vomiting.   polyethylene glycol 17 g packet Commonly known as: MIRALAX / GLYCOLAX Take 17 g by mouth 2 (two) times daily.   Vitamin D 50 MCG (2000 UT) Caps Take 4,000 Units by mouth daily.

## 2021-06-20 NOTE — Discharge Instructions (Signed)
Stent will be removed in office on 06/28/21 with Dr. Wynema Birch may see some blood in the urine and may have some burning with urination for 48-72 hours. You also may notice that you have to urinate more frequently or urgently after your procedure which is normal.  You should call should you develop an inability urinate, fever > 101, persistent nausea and vomiting that prevents you from eating or drinking to stay hydrated.  If you have a stent, you will likely urinate more frequently and urgently until the stent is removed and you may experience some discomfort/pain in the lower abdomen and flank especially when urinating. You may take pain medication prescribed to you if needed for pain. You may also intermittently have blood in the urine until the stent is removed. If you have a catheter, you will be taught how to take care of the catheter by the nursing staff prior to discharge from the hospital.  You may periodically feel a strong urge to void with the catheter in place.  This is a bladder spasm and most often can occur when having a bowel movement or moving around. It is typically self-limited and usually will stop after a few minutes.  You may use some Vaseline or Neosporin around the tip of the catheter to reduce friction at the tip of the penis. You may also see some blood in the urine.  A very small amount of blood can make the urine look quite red.  As long as the catheter is draining well, there usually is not a problem.  However, if the catheter is not draining well and is bloody, you should call the office [phone]) to notify us.

## 2021-06-20 NOTE — Progress Notes (Signed)
Urology Progress Note   1 Day Post-Op from right PCNL, antegrade stent placement.   Subjective: NAEON.  Stable.  Mid 4.1 L of urine output.  She did have an episode of emesis last night after food around 7:30 PM.  Labs this morning are stable with hemoglobin of 12 and creatinine 0.6.  Objective: Vital signs in last 24 hours: Temp:  [97 F (36.1 C)-98.6 F (37 C)] 98.3 F (36.8 C) (08/02 0538) Pulse Rate:  [58-76] 58 (08/02 0538) Resp:  [12-21] 18 (08/01 2055) BP: (123-170)/(76-99) 131/88 (08/02 0538) SpO2:  [93 %-100 %] 98 % (08/02 0538) Weight:  [78.9 kg-79.8 kg] 79.8 kg (08/01 1028)  Intake/Output from previous day: 08/01 0701 - 08/02 0700 In: 3642.5 [P.O.:240; I.V.:3402.5] Out: 4250 [Urine:4150; Blood:100] Intake/Output this shift: No intake/output data recorded.  Physical Exam:  General: Alert and oriented CV: Regular rate Lungs: No increased work of breathing Abdomen: Soft, appropriately tender. Incision c/d/i.  GU: Foley in place draining clear urine Ext: NT, No erythema  Lab Results: Recent Labs    06/19/21 1410 06/20/21 0415  HGB 12.0 12.1  HCT 37.5 38.0   Recent Labs    06/19/21 1410 06/20/21 0415  NA 140 138  K 3.8 4.3  CL 108 104  CO2 26 26  GLUCOSE 107* 144*  BUN 16 11  CREATININE 0.64 0.59  CALCIUM 8.4* 8.7*    Studies/Results: DG C-Arm 1-60 Min-No Report  Result Date: 06/19/2021 Fluoroscopy was utilized by the requesting physician.  No radiographic interpretation.   IR URETERAL STENT RIGHT NEW ACCESS W/O SEP NEPHROSTOMY CATH  Result Date: 06/19/2021 INDICATION: 60 year old female with history of right nephrolithiasis. EXAM: 1. FLUOROSCOPIC GUIDANCE FOR PUNCTURE OF THE right SIDED RENAL COLLECTING SYSTEM. 2. FLUOROSCOPIC GUIDED PLACEMENT OF A right SIDED NEPHROURETERAL CATHETER. COMPARISON:  CT of the abdomen and pelvis-05/12/2021 MEDICATIONS: Ancef 2 gm IV; The antibiotic was administered in an appropriate time frame prior to skin puncture.  ANESTHESIA/SEDATION: Moderate (conscious) sedation was employed during this procedure. A total of Versed 4 mg and Fentanyl 100 mcg was administered intravenously. Moderate Sedation Time: 29 minutes. The patient's level of consciousness and vital signs were monitored continuously by radiology nursing throughout the procedure under my direct supervision. CONTRAST:  5 mL Omnipaque 300-Administered into the renal collecting system. FLUOROSCOPY TIME:  Six minutes 35 seconds (55 mGy) COMPLICATIONS: None immediate. PROCEDURE: Informed written consent was obtained from the patient after a discussion of the risks, benefits, and alternatives to treatment. The right flank region was prepped with chlorhexidine in a sterile fashion, and a sterile drape was applied covering the operative field. A sterile gown and sterile gloves were used for the procedure. A timeout was performed prior to the initiation of the procedure. A pre procedural spot fluoroscopic image was obtained of the upper abdomen. Ultrasound scanning performed of the kidney was negative for significant hydronephrosis. As such, the stone within right lower pole was targeted via ultrasound guidance with a 21 gauge Chiba needle. Access to the collecting system was confirmed with advancement of a Nitrex wire into the collecting system. The needle was exchanged for the inner 3 French catheter from an [place] and contrast injection confirmed access. An Accustick set was utilized to dilate the tract and was subsequently exchanged for a Kumpe catheter over a glide wire. The Kumpe catheter was advanced down the ureter and into the urinary bladder. Postprocedural spot radiographs were obtained in various obliquities and the catheter was sutured to the skin. The catheter  was capped and a dressing was placed. The patient tolerated the procedure well without immediate post procedural complication. FINDINGS: Pre procedural spot radiographic images demonstrates large right  inferior pole renal calculus in addition to renal calculus within the renal pelvis. Technically successful lower pole access via ultrasound and fluoroscopic guidance with eventual placement of 5 French angled tip catheter through the ureter into the urinary bladder. IMPRESSION: Successful fluoroscopic guided placement of a right sided 5 [place] Kumpe catheter to the level of the urinary bladder to be utilized during impending nephrolithotomy procedure. Ruthann Cancer, MD [facility] Radiology Electronically Signed   By: Ruthann Cancer MD   On: 06/19/2021 12:56    Assessment/Plan:  60 y.o. female s/p right PCNL antegrade stent placement.  Overall doing well post-op.   -Plan to trial void her today stop her IV fluids -Likely home today  Dispo: Likely home today   LOS: 0 days

## 2022-06-19 IMAGING — US IR URETURAL STENT RIGHT NEW ACCESS W/O SEP NEPHROSTOMY CATH
1 series · 1 of 1 positions shown · non-contrast
Comparison: CT of the abdomen and pelvis-05/12/2021

INDICATION: 59-year-old female with history of right nephrolithiasis.

EXAM:
1. FLUOROSCOPIC GUIDANCE FOR PUNCTURE OF THE right SIDED RENAL
COLLECTING SYSTEM.
2. FLUOROSCOPIC GUIDED PLACEMENT OF A right SIDED NEPHROURETERAL
CATHETER.

[Series 1: ir (id) (id)/(id)/(id) ir · 1 of 1 slices shown]
[im 1/1]
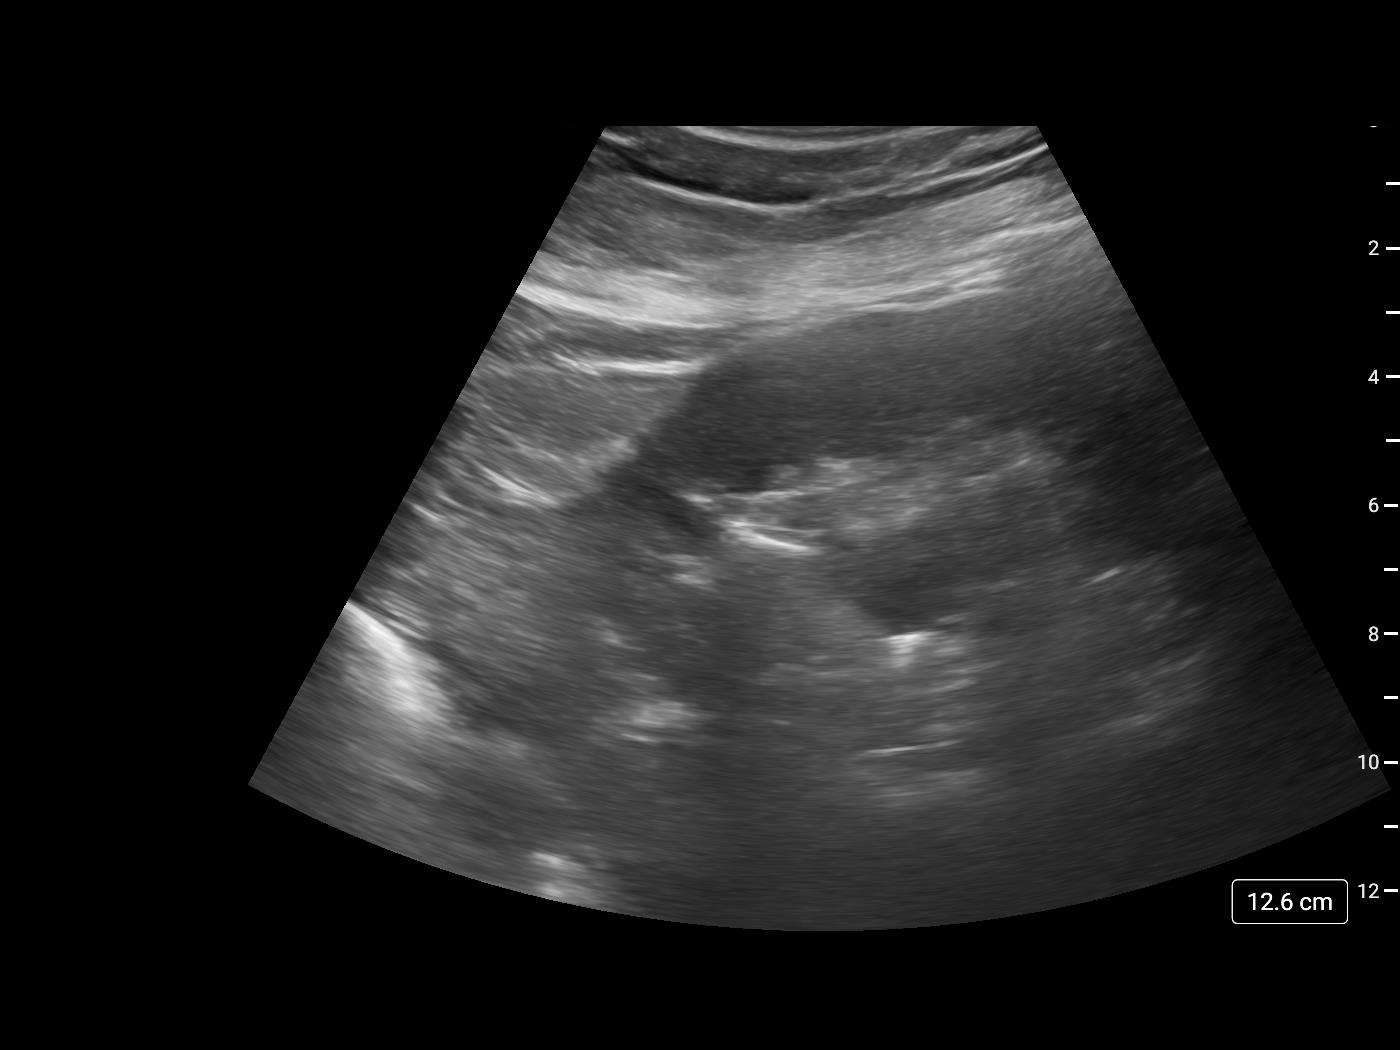

[1 of 1 positions shown; findings below may reference images not displayed]

MEDICATIONS:
Ancef 2 gm IV; The antibiotic was administered in an appropriate
time frame prior to skin puncture.

ANESTHESIA/SEDATION:
Moderate (conscious) sedation was employed during this procedure. A
total of Versed 4 mg and Fentanyl 100 mcg was administered
intravenously.

Moderate Sedation Time: 29 minutes. The patient's level of
consciousness and vital signs were monitored continuously by
radiology nursing throughout the procedure under my direct
supervision.

CONTRAST:  5 mL Omnipaque 300-Administered into the renal collecting
system.

FLUOROSCOPY TIME:  Six minutes 35 seconds (55 mGy)

COMPLICATIONS:
None immediate.

PROCEDURE:
Informed written consent was obtained from the patient after a
discussion of the risks, benefits, and alternatives to treatment.
The right flank region was prepped with chlorhexidine in a sterile
fashion, and a sterile drape was applied covering the operative
field. A sterile gown and sterile gloves were used for the
procedure. A timeout was performed prior to the initiation of the
procedure.

A pre procedural spot fluoroscopic image was obtained of the upper
abdomen. Ultrasound scanning performed of the kidney was negative
for significant hydronephrosis. As such, the stone within right
lower pole was targeted via ultrasound guidance with a 21 gauge
Chiba needle. Access to the collecting system was confirmed with
advancement of a Nitrex wire into the collecting system. The needle
was exchanged for the inner 3 French catheter from an Accustick set
and contrast injection confirmed access. An Accustick set was
utilized to dilate the tract and was subsequently exchanged for a
Kumpe catheter over a glide wire. The Kumpe catheter was advanced
down the ureter and into the urinary bladder.

Postprocedural spot radiographs were obtained in various obliquities
and the catheter was sutured to the skin. The catheter was capped
and a dressing was placed.

The patient tolerated the procedure well without immediate post
procedural complication.
FINDINGS: Pre procedural spot radiographic images demonstrates large right
inferior pole renal calculus in addition to renal calculus within
the renal pelvis.

Technically successful lower pole access via ultrasound and
fluoroscopic guidance with eventual placement of 5 French angled tip
catheter through the ureter into the urinary bladder.
IMPRESSION: Successful fluoroscopic guided placement of a right sided 5 French
Kumpe catheter to the level of the urinary bladder to be utilized
during impending nephrolithotomy procedure.
